# Patient Record
Sex: Male | Born: 1960 | Race: White | Hispanic: No | Smoking: Former smoker
Health system: Southern US, Community
[De-identification: ages and names within clinical notes are randomized; demographics above are authoritative.]

## PROBLEM LIST (undated history)

## (undated) DIAGNOSIS — E785 Hyperlipidemia, unspecified: Secondary | ICD-10-CM

## (undated) DIAGNOSIS — G629 Polyneuropathy, unspecified: Secondary | ICD-10-CM

## (undated) DIAGNOSIS — G4730 Sleep apnea, unspecified: Secondary | ICD-10-CM

## (undated) DIAGNOSIS — I872 Venous insufficiency (chronic) (peripheral): Secondary | ICD-10-CM

## (undated) DIAGNOSIS — K219 Gastro-esophageal reflux disease without esophagitis: Secondary | ICD-10-CM

## (undated) DIAGNOSIS — F419 Anxiety disorder, unspecified: Secondary | ICD-10-CM

## (undated) DIAGNOSIS — F329 Major depressive disorder, single episode, unspecified: Secondary | ICD-10-CM

## (undated) DIAGNOSIS — I10 Essential (primary) hypertension: Secondary | ICD-10-CM

## (undated) DIAGNOSIS — Z87442 Personal history of urinary calculi: Secondary | ICD-10-CM

## (undated) DIAGNOSIS — F32A Depression, unspecified: Secondary | ICD-10-CM

## (undated) DIAGNOSIS — M1990 Unspecified osteoarthritis, unspecified site: Secondary | ICD-10-CM

## (undated) DIAGNOSIS — I2510 Atherosclerotic heart disease of native coronary artery without angina pectoris: Secondary | ICD-10-CM

## (undated) DIAGNOSIS — M109 Gout, unspecified: Secondary | ICD-10-CM

## (undated) DIAGNOSIS — D497 Neoplasm of unspecified behavior of endocrine glands and other parts of nervous system: Secondary | ICD-10-CM

## (undated) HISTORY — DX: Hyperlipidemia, unspecified: E78.5

## (undated) HISTORY — PX: TONSILLECTOMY: SUR1361

## (undated) HISTORY — PX: TONSILLECTOMY AND ADENOIDECTOMY: SHX28

## (undated) HISTORY — DX: Neoplasm of unspecified behavior of endocrine glands and other parts of nervous system: D49.7

## (undated) HISTORY — DX: Atherosclerotic heart disease of native coronary artery without angina pectoris: I25.10

## (undated) HISTORY — DX: Venous insufficiency (chronic) (peripheral): I87.2

## (undated) HISTORY — DX: Sleep apnea, unspecified: G47.30

---
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
## 2013-09-15 HISTORY — PX: ADRENALECTOMY: SHX876

## 2014-03-24 ENCOUNTER — Emergency Department: Payer: Self-pay | Admitting: Emergency Medicine

## 2015-12-15 HISTORY — PX: SALIVARY GLAND SURGERY: SHX768

## 2016-07-17 ENCOUNTER — Other Ambulatory Visit: Payer: Self-pay | Admitting: Podiatry

## 2016-07-18 NOTE — Patient Instructions (Addendum)
Jacob Munoz  07/18/2016     @PREFPERIOPPHARMACY @   Your procedure is scheduled on  07/23/2016   Report to [facility] at  850  A.M.  Call this number if you have problems the morning of surgery:  [phone]   Remember:  Do not eat food or drink liquids after midnight.  Take these medicines the morning of surgery with A SIP OF WATER  Wellbutrin, hydralazine, lisinopril, metoprolol, pamelor, prilosec.   Do not wear jewelry, make-up or nail polish.  Do not wear lotions, powders, or perfumes, or deoderant.  Do not shave 48 hours prior to surgery.  Men may shave face and neck.  Do not bring valuables to the hospital.  [facility] is not responsible for any belongings or valuables.  Contacts, dentures or bridgework may not be worn into surgery.  Leave your suitcase in the car.  After surgery it may be brought to your room.  For patients admitted to the hospital, discharge time will be determined by your treatment team.  Patients discharged the day of surgery will not be allowed to drive home.   Name and phone number of your driver:   family Special instructions:  none  Please read over the following fact sheets that you were given. Anesthesia Post-op Instructions and Care and Recovery After Surgery       Incision Care  An incision (cut) is when a surgeon cuts into your body. After surgery, the cut needs to be well cared for to keep it from getting infected.  HOW TO CARE FOR YOUR CUT  Take medicines only as told by your doctor.  There are many different ways to close and cover a cut, including stitches, skin glue, and adhesive strips. Follow your doctor's instructions on:  Care of the cut.  Bandage (dressing) changes and removal.  Cut closure removal.  Do not take baths, swim, or use a hot tub until your doctor says it is okay. You may shower as told by your doctor.  Return to your normal diet and activities as allowed by your doctor.  Use medicine that  helps lessen itching on your cut as told by your doctor. Do not pick or scratch at your cut.  Drink enough fluids to keep your pee (urine) clear or pale yellow. GET HELP IF:  You have redness, puffiness (swelling), or pain at the site of your cut.  You have fluid, blood, or pus coming from your cut.  Your muscles ache.  You have chills or you feel sick.  You have a bad smell coming from the cut or bandage.  Your cut opens up after stitches, staples, or adhesive strips have been removed.  You keep feeling sick to your stomach (nauseous) or keep throwing up (vomiting).  You have a fever.  You are dizzy. GET HELP RIGHT AWAY IF:  You have a [place].  You pass out (faint).  You have trouble breathing. MAKE SURE YOU:   Understand these instructions.  Will watch your condition.  Will get help right away if you are not doing well or get worse.   This information is not intended to replace advice given to you by your health care provider. Make sure you discuss any questions you have with your health care provider.   Document Released: 11/24/2011 Document Revised: 09/22/2014 Document Reviewed: 10/26/2013 Elsevier Interactive Patient Education 2016 [place] Monitored anesthesia care is an anesthesia service for a medical  procedure. Anesthesia is the loss of the ability to feel pain. It is produced by medicines called anesthetics. It may affect a small area of your body (local anesthesia), a large area of your body (regional anesthesia), or your entire body (general anesthesia). The need for monitored anesthesia care depends your procedure, your condition, and the potential need for regional or general anesthesia. It is often provided during procedures where:   General anesthesia may be needed if there are complications. This is because you need special care when you are under general anesthesia.   You will be under local or regional anesthesia. This is  so that you are able to have higher levels of anesthesia if needed.   You will receive calming medicines (sedatives). This is especially the case if sedatives are given to put you in a semi-conscious state of relaxation (deep sedation). This is because the amount of sedative needed to produce this state can be hard to predict. Too much of a sedative can produce general anesthesia. Monitored anesthesia care is performed by one or more health care providers who have special training in all types of anesthesia. You will need to meet with these health care providers before your procedure. During this meeting, they will ask you about your medical history. They will also give you instructions to follow. (For example, you will need to stop eating and drinking before your procedure. You may also need to stop or change medicines you are taking.) During your procedure, your health care providers will stay with you. They will:   Watch your condition. This includes watching your blood pressure, breathing, and level of pain.   Diagnose and treat problems that occur.   Give medicines if they are needed. These may include calming medicines (sedatives) and anesthetics.   Make sure you are comfortable.  Having monitored anesthesia care does not necessarily mean that you will be under anesthesia. It does mean that your health care providers will be able to manage anesthesia if you need it or if it occurs. It also means that you will be able to have a different type of anesthesia than you are having if you need it. When your procedure is complete, your health care providers will continue to watch your condition. They will make sure any medicines wear off before you are allowed to go home.    This information is not intended to replace advice given to you by your health care provider. Make sure you discuss any questions you have with your health care provider.   Document Released: 05/28/2005 Document Revised:  09/22/2014 Document Reviewed: 10/13/2012 Elsevier Interactive Patient Education 2016 [employer] PATIENT INSTRUCTIONS POST-ANESTHESIA  IMMEDIATELY FOLLOWING SURGERY:  Do not drive or operate machinery for the first twenty four hours after surgery.  Do not make any important decisions for twenty four hours after surgery or while taking narcotic pain medications or sedatives.  If you develop intractable nausea and vomiting or a severe headache please notify your doctor immediately.  FOLLOW-UP:  Please make an appointment with your surgeon as instructed. You do not need to follow up with anesthesia unless specifically instructed to do so.  WOUND CARE INSTRUCTIONS (if applicable):  Keep a dry clean dressing on the anesthesia/puncture wound site if there is drainage.  Once the wound has quit draining you may leave it open to air.  Generally you should leave the bandage intact for twenty four hours unless there is drainage.  If the epidural site drains for more than  36-48 hours please call the anesthesia department.  QUESTIONS?:  Please feel free to call your physician or the hospital operator if you have any questions, and they will be happy to assist you.

## 2016-07-21 ENCOUNTER — Encounter (HOSPITAL_COMMUNITY)
Admission: RE | Admit: 2016-07-21 | Discharge: 2016-07-21 | Disposition: A | Discharge status: Home / Self Care | Payer: BLUE CROSS/BLUE SHIELD | Source: Ambulatory Visit | Attending: Podiatry | Admitting: Podiatry

## 2016-07-21 ENCOUNTER — Encounter (HOSPITAL_COMMUNITY): Payer: Self-pay

## 2016-07-21 ENCOUNTER — Ambulatory Visit (HOSPITAL_COMMUNITY)
Admission: RE | Admit: 2016-07-21 | Discharge: 2016-07-21 | Disposition: A | Discharge status: Home / Self Care | Payer: BLUE CROSS/BLUE SHIELD | Source: Ambulatory Visit | Attending: Podiatry | Admitting: Podiatry

## 2016-07-21 ENCOUNTER — Other Ambulatory Visit: Payer: Self-pay

## 2016-07-21 DIAGNOSIS — Z01818 Encounter for other preprocedural examination: Secondary | ICD-10-CM | POA: Insufficient documentation

## 2016-07-21 DIAGNOSIS — M2030 Hallux varus (acquired), unspecified foot: Secondary | ICD-10-CM

## 2016-07-21 DIAGNOSIS — Z01812 Encounter for preprocedural laboratory examination: Secondary | ICD-10-CM | POA: Insufficient documentation

## 2016-07-21 DIAGNOSIS — M2040 Other hammer toe(s) (acquired), unspecified foot: Secondary | ICD-10-CM | POA: Insufficient documentation

## 2016-07-21 HISTORY — DX: Gastro-esophageal reflux disease without esophagitis: K21.9

## 2016-07-21 HISTORY — DX: Major depressive disorder, single episode, unspecified: F32.9

## 2016-07-21 HISTORY — DX: Depression, unspecified: F32.A

## 2016-07-21 HISTORY — DX: Anxiety disorder, unspecified: F41.9

## 2016-07-21 HISTORY — DX: Essential (primary) hypertension: I10

## 2016-07-21 LAB — CBC WITH DIFFERENTIAL/PLATELET
Basophils Absolute: 0 10*3/uL (ref 0.0–0.1)
Basophils Relative: 0 %
Eosinophils Absolute: 0.3 10*3/uL (ref 0.0–0.7)
Eosinophils Relative: 2 %
HEMATOCRIT: 50.6 % (ref 39.0–52.0)
Hemoglobin: 17.2 g/dL — ABNORMAL HIGH (ref 13.0–17.0)
LYMPHS ABS: 3.2 10*3/uL (ref 0.7–4.0)
LYMPHS PCT: 28 %
MCH: 28.5 pg (ref 26.0–34.0)
MCHC: 34 g/dL (ref 30.0–36.0)
MCV: 83.8 fL (ref 78.0–100.0)
MONO ABS: 1 10*3/uL (ref 0.1–1.0)
MONOS PCT: 9 %
NEUTROS ABS: 6.9 10*3/uL (ref 1.7–7.7)
Neutrophils Relative %: 61 %
Platelets: 211 10*3/uL (ref 150–400)
RBC: 6.04 MIL/uL — ABNORMAL HIGH (ref 4.22–5.81)
RDW: 14.1 % (ref 11.5–15.5)
WBC: 11.3 10*3/uL — ABNORMAL HIGH (ref 4.0–10.5)

## 2016-07-21 LAB — BASIC METABOLIC PANEL
Anion gap: 9 (ref 5–15)
BUN: 20 mg/dL (ref 6–20)
CALCIUM: 9.2 mg/dL (ref 8.9–10.3)
CO2: 25 mmol/L (ref 22–32)
CREATININE: 1.34 mg/dL — AB (ref 0.61–1.24)
Chloride: 101 mmol/L (ref 101–111)
GFR calc Af Amer: >60 mL/min (ref >60)
GFR calc non Af Amer: 59 mL/min — ABNORMAL LOW (ref >60)
GLUCOSE: 179 mg/dL — AB (ref 65–99)
Potassium: 3.2 mmol/L — ABNORMAL LOW (ref 3.5–5.1)
Sodium: 135 mmol/L (ref 135–145)

## 2016-07-21 LAB — SURGICAL PCR SCREEN
MRSA, PCR: NEGATIVE
Staphylococcus aureus: NEGATIVE

## 2016-07-21 MED ORDER — VANCOMYCIN HCL 10 G IV SOLR: 1000.0000 mg | Freq: Once | INTRAVENOUS | Status: DC

## 2016-07-22 NOTE — Pre-Procedure Instructions (Signed)
Dr Patsey Berthold aware of potassium-3.2, glucose-179 and WBC of 11.3. Will repeat Istat on arrival per his order morning of surgery.

## 2016-07-22 NOTE — Pre-Procedure Instructions (Signed)
Abnormal labs called to Dr Serita Grit office. No orders given at this time.

## 2016-07-23 ENCOUNTER — Ambulatory Visit (HOSPITAL_COMMUNITY): Payer: BLUE CROSS/BLUE SHIELD | Admitting: Anesthesiology

## 2016-07-23 ENCOUNTER — Ambulatory Visit (HOSPITAL_COMMUNITY)
Admission: RE | Admit: 2016-07-23 | Discharge: 2016-07-23 | Disposition: A | Discharge status: Home / Self Care | Payer: BLUE CROSS/BLUE SHIELD | Source: Ambulatory Visit | Attending: Podiatry | Admitting: Podiatry

## 2016-07-23 ENCOUNTER — Ambulatory Visit (HOSPITAL_COMMUNITY): Payer: BLUE CROSS/BLUE SHIELD

## 2016-07-23 ENCOUNTER — Encounter (HOSPITAL_COMMUNITY)
Admission: RE | Disposition: A | Discharge status: Home / Self Care | Payer: Self-pay | Source: Ambulatory Visit | Attending: Podiatry

## 2016-07-23 ENCOUNTER — Encounter (HOSPITAL_COMMUNITY): Payer: Self-pay | Admitting: *Deleted

## 2016-07-23 DIAGNOSIS — Z87891 Personal history of nicotine dependence: Secondary | ICD-10-CM | POA: Insufficient documentation

## 2016-07-23 DIAGNOSIS — Z9889 Other specified postprocedural states: Secondary | ICD-10-CM

## 2016-07-23 DIAGNOSIS — M24574 Contracture, right foot: Secondary | ICD-10-CM | POA: Diagnosis not present

## 2016-07-23 DIAGNOSIS — M2041 Other hammer toe(s) (acquired), right foot: Secondary | ICD-10-CM | POA: Insufficient documentation

## 2016-07-23 DIAGNOSIS — F329 Major depressive disorder, single episode, unspecified: Secondary | ICD-10-CM | POA: Diagnosis not present

## 2016-07-23 DIAGNOSIS — I10 Essential (primary) hypertension: Secondary | ICD-10-CM | POA: Diagnosis not present

## 2016-07-23 DIAGNOSIS — L97519 Non-pressure chronic ulcer of other part of right foot with unspecified severity: Secondary | ICD-10-CM | POA: Diagnosis not present

## 2016-07-23 DIAGNOSIS — K219 Gastro-esophageal reflux disease without esophagitis: Secondary | ICD-10-CM | POA: Diagnosis not present

## 2016-07-23 DIAGNOSIS — F419 Anxiety disorder, unspecified: Secondary | ICD-10-CM | POA: Diagnosis not present

## 2016-07-23 HISTORY — PX: DISTAL INTERPHALANGEAL JOINT FUSION: SHX6428

## 2016-07-23 HISTORY — PX: REPAIR EXTENSOR TENDON: SHX5382

## 2016-07-23 LAB — POCT I-STAT 4, (NA,K, GLUC, HGB,HCT)
GLUCOSE: 169 mg/dL — AB (ref 65–99)
HCT: 53 % — ABNORMAL HIGH (ref 39.0–52.0)
HEMOGLOBIN: 18 g/dL — AB (ref 13.0–17.0)
POTASSIUM: 4.1 mmol/L (ref 3.5–5.1)
SODIUM: 139 mmol/L (ref 135–145)

## 2016-07-23 LAB — GLUCOSE, CAPILLARY: Glucose-Capillary: 156 mg/dL — ABNORMAL HIGH (ref 65–99)

## 2016-07-23 SURGERY — DISTAL INTERPHALANGEAL JOINT FUSION
Anesthesia: Monitor Anesthesia Care | Site: Toe | Laterality: Right

## 2016-07-23 MED ORDER — 0.9 % SODIUM CHLORIDE (POUR BTL) OPTIME
TOPICAL | Status: DC | PRN
  Administered 2016-07-23: 1000 mL

## 2016-07-23 MED ORDER — FENTANYL CITRATE (PF) 100 MCG/2ML IJ SOLN: 25.0000 ug | INTRAMUSCULAR | Status: DC | PRN

## 2016-07-23 MED ORDER — DEXTROSE 5 % IV SOLN: INTRAVENOUS | Status: DC | PRN

## 2016-07-23 MED ORDER — CHLORHEXIDINE GLUCONATE CLOTH 2 % EX PADS: 6.0000 | MEDICATED_PAD | Freq: Once | CUTANEOUS | Status: DC

## 2016-07-23 MED ORDER — BUPIVACAINE HCL (PF) 0.5 % IJ SOLN
INTRAMUSCULAR | Status: AC
  Filled 2016-07-23: qty 30

## 2016-07-23 MED ORDER — LACTATED RINGERS IV SOLN
INTRAVENOUS | Status: DC
  Administered 2016-07-23: 1000 mL via INTRAVENOUS

## 2016-07-23 MED ORDER — MIDAZOLAM HCL 5 MG/5ML IJ SOLN
INTRAMUSCULAR | Status: DC | PRN
  Administered 2016-07-23: 2 mg via INTRAVENOUS

## 2016-07-23 MED ORDER — PROPOFOL 500 MG/50ML IV EMUL
INTRAVENOUS | Status: DC | PRN
  Administered 2016-07-23: 50 ug/kg/min via INTRAVENOUS
  Administered 2016-07-23: 12:00:00 via INTRAVENOUS

## 2016-07-23 MED ORDER — FENTANYL CITRATE (PF) 100 MCG/2ML IJ SOLN
INTRAMUSCULAR | Status: AC
  Filled 2016-07-23: qty 2

## 2016-07-23 MED ORDER — LIDOCAINE HCL (PF) 1 % IJ SOLN
INTRAMUSCULAR | Status: AC
  Filled 2016-07-23: qty 30

## 2016-07-23 MED ORDER — FENTANYL CITRATE (PF) 100 MCG/2ML IJ SOLN
25.0000 ug | INTRAMUSCULAR | Status: AC | PRN
  Administered 2016-07-23 (×2): 25 ug via INTRAVENOUS
  Filled 2016-07-23: qty 2

## 2016-07-23 MED ORDER — MIDAZOLAM HCL 2 MG/2ML IJ SOLN
1.0000 mg | INTRAMUSCULAR | Status: DC | PRN
  Administered 2016-07-23 (×2): 2 mg via INTRAVENOUS
  Filled 2016-07-23 (×2): qty 2

## 2016-07-23 MED ORDER — VANCOMYCIN HCL IN DEXTROSE 1-5 GM/200ML-% IV SOLN
1000.0000 mg | Freq: Once | INTRAVENOUS | Status: AC
Start: 2016-07-23 — End: 2016-07-23
  Administered 2016-07-23: 1000 mg via INTRAVENOUS
  Filled 2016-07-23: qty 200

## 2016-07-23 MED ORDER — BUPIVACAINE HCL 0.5 % IJ SOLN
INTRAMUSCULAR | Status: DC | PRN
  Administered 2016-07-23: 10 mL via INTRAMUSCULAR

## 2016-07-23 MED ORDER — FENTANYL CITRATE (PF) 100 MCG/2ML IJ SOLN
INTRAMUSCULAR | Status: DC | PRN
  Administered 2016-07-23 (×2): 50 ug via INTRAVENOUS

## 2016-07-23 MED ORDER — BUPIVACAINE HCL (PF) 0.5 % IJ SOLN
INTRAMUSCULAR | Status: DC | PRN
  Administered 2016-07-23: 10 mL

## 2016-07-23 SURGICAL SUPPLY — 57 items
BAG HAMPER (MISCELLANEOUS) ×3 IMPLANT
BANDAGE ESMARK 4X12 BL STRL LF (DISPOSABLE) ×1 IMPLANT
BENZOIN TINCTURE PRP APPL 2/3 (GAUZE/BANDAGES/DRESSINGS) ×3 IMPLANT
BIT DRILL 2.0X128 (BIT) ×2 IMPLANT
BIT DRILL 2.0X128MM (BIT) ×1
BLADE 15 SAFETY STRL DISP (BLADE) ×6 IMPLANT
BLADE AVERAGE 25MMX9MM (BLADE) ×1
BLADE AVERAGE 25X9 (BLADE) ×2 IMPLANT
BNDG COHESIVE 4X5 TAN STRL (GAUZE/BANDAGES/DRESSINGS) ×3 IMPLANT
BNDG CONFORM 2 STRL LF (GAUZE/BANDAGES/DRESSINGS) ×3 IMPLANT
BNDG ESMARK 4X12 BLUE STRL LF (DISPOSABLE) ×3
BNDG GAUZE ELAST 4 BULKY (GAUZE/BANDAGES/DRESSINGS) ×3 IMPLANT
BOOT STEPPER DURA LG (SOFTGOODS) IMPLANT
BOOT STEPPER DURA MED (SOFTGOODS) IMPLANT
BOOT STEPPER DURA SM (SOFTGOODS) IMPLANT
BOOT STEPPER DURA XLG (SOFTGOODS) IMPLANT
CHLORAPREP W/TINT 26ML (MISCELLANEOUS) ×3 IMPLANT
CLIP EASY STAPLE NITINOL (Clip) ×3 IMPLANT
CLIP EZ FIXATION 10X10X10 (Staple) ×3 IMPLANT
CLOSURE WOUND 1/2 X4 (GAUZE/BANDAGES/DRESSINGS) ×1
CLOTH BEACON ORANGE TIMEOUT ST (SAFETY) ×3 IMPLANT
COVER LIGHT HANDLE STERIS (MISCELLANEOUS) ×6 IMPLANT
CUFF TOURNIQUET SINGLE 18IN (TOURNIQUET CUFF) ×3 IMPLANT
CUFF TOURNIQUET SINGLE 24IN (TOURNIQUET CUFF) ×3 IMPLANT
DECANTER SPIKE VIAL GLASS SM (MISCELLANEOUS) ×6 IMPLANT
DRAPE OEC MINIVIEW 54X84 (DRAPES) ×3 IMPLANT
DRSG ADAPTIC 3X8 NADH LF (GAUZE/BANDAGES/DRESSINGS) ×3 IMPLANT
DRSG TELFA 3X8 NADH (GAUZE/BANDAGES/DRESSINGS) ×3 IMPLANT
ELECT REM PT RETURN 9FT ADLT (ELECTROSURGICAL) ×3
ELECTRODE REM PT RTRN 9FT ADLT (ELECTROSURGICAL) ×1 IMPLANT
GAUZE SPONGE 4X4 12PLY STRL (GAUZE/BANDAGES/DRESSINGS) ×3 IMPLANT
GLOVE BIO SURGEON STRL SZ7.5 (GLOVE) ×3 IMPLANT
GLOVE BIOGEL PI IND STRL 6.5 (GLOVE) ×1 IMPLANT
GLOVE BIOGEL PI IND STRL 7.0 (GLOVE) ×2 IMPLANT
GLOVE BIOGEL PI INDICATOR 6.5 (GLOVE) ×2
GLOVE BIOGEL PI INDICATOR 7.0 (GLOVE) ×4
GLOVE ECLIPSE 7.0 STRL STRAW (GLOVE) ×3 IMPLANT
GOWN STRL REUS W/ TWL LRG LVL3 (GOWN DISPOSABLE) ×2 IMPLANT
GOWN STRL REUS W/TWL LRG LVL3 (GOWN DISPOSABLE) ×7 IMPLANT
K-WIRE 6 (WIRE) ×3
KIT ROOM TURNOVER AP CYSTO (KITS) ×3 IMPLANT
KWIRE 6 (WIRE) ×1 IMPLANT
MANIFOLD NEPTUNE II (INSTRUMENTS) ×3 IMPLANT
NEEDLE HYPO 27GX1-1/4 (NEEDLE) ×6 IMPLANT
NS IRRIG 1000ML POUR BTL (IV SOLUTION) ×3 IMPLANT
PACK BASIC LIMB (CUSTOM PROCEDURE TRAY) ×3 IMPLANT
PAD ARMBOARD 7.5X6 YLW CONV (MISCELLANEOUS) ×3 IMPLANT
RASP SM TEAR CROSS CUT (RASP) ×3 IMPLANT
SET BASIN LINEN APH (SET/KITS/TRAYS/PACK) ×3 IMPLANT
SPONGE LAP 18X18 X RAY DECT (DISPOSABLE) ×3 IMPLANT
STRIP CLOSURE SKIN 1/2X4 (GAUZE/BANDAGES/DRESSINGS) ×2 IMPLANT
SUT MON AB 5-0 PS2 18 (SUTURE) ×3 IMPLANT
SUT VIC AB 3-0 SH 27 (SUTURE) ×2
SUT VIC AB 3-0 SH 27X BRD (SUTURE) ×1 IMPLANT
SUT VIC AB 4-0 PS2 27 (SUTURE) ×3 IMPLANT
SYR CONTROL 10ML LL (SYRINGE) ×6 IMPLANT
WATER STERILE IRR 1000ML POUR (IV SOLUTION) ×3 IMPLANT

## 2016-07-23 NOTE — Transfer of Care (Signed)
Immediate Anesthesia Transfer of Care Note  Patient: Jacob Munoz  Procedure(s) Performed: Procedure(s): RIGHT 1ST INTERPHALANGEAL JOINT FUSION (Right) EXTENSOR TENDON LENGTHENING (Right)  Patient Location: PACU  Anesthesia Type:MAC  Level of Consciousness: awake, alert  and oriented  Airway & Oxygen Therapy: Patient Spontanous Breathing and Patient connected to nasal cannula oxygen  Post-op Assessment: Report given to RN  Post vital signs: Reviewed and stable  Last Vitals:  Vitals:   07/23/16 1045 07/23/16 1050  BP: (!) 109/58 119/75  Pulse:    Resp: 12 (!) 22  Temp:      Last Pain: There were no vitals filed for this visit.       Complications: No apparent anesthesia complications

## 2016-07-23 NOTE — H&P (Signed)
HISTORY AND PHYSICAL INTERVAL NOTE:  07/23/2016  10:23 AM  Jacob Munoz  has presented today for surgery, with the diagnosis of acquired hallux malleus.  The various methods of treatment have been discussed with the patient.  No guarantees were given.  After consideration of risks, benefits and other options for treatment, the patient has consented to surgery.  I have reviewed the patients' chart and labs.    Patient Vitals for the past 24 hrs:  BP Temp Pulse Resp SpO2  07/23/16 0950 - - - 13 92 %  07/23/16 0945 124/73 - - 16 93 %  07/23/16 0940 130/88 - - 14 93 %  07/23/16 0935 132/87 - - 17 94 %  07/23/16 0930 132/87 97.8 F (36.6 C) 77 - 93 %    A history and physical examination was performed in my office.  The patient was reexamined.  There have been no changes to this history and physical examination.  Tyson Babinski, DPM

## 2016-07-23 NOTE — Discharge Instructions (Signed)

## 2016-07-23 NOTE — Anesthesia Postprocedure Evaluation (Signed)
Anesthesia Post Note  Patient: Jacob Munoz  Procedure(s) Performed: Procedure(s) (LRB): RIGHT 1ST INTERPHALANGEAL JOINT FUSION (Right) EXTENSOR TENDON LENGTHENING (Right)  Patient location during evaluation: PACU Anesthesia Type: MAC Level of consciousness: awake and alert and oriented Pain management: pain level controlled Vital Signs Assessment: post-procedure vital signs reviewed and stable Respiratory status: spontaneous breathing Cardiovascular status: blood pressure returned to baseline Postop Assessment: no signs of nausea or vomiting and adequate PO intake Anesthetic complications: no    Last Vitals:  Vitals:   07/23/16 1045 07/23/16 1050  BP: (!) 109/58 119/75  Pulse:    Resp: 12 (!) 22  Temp:      Last Pain: There were no vitals filed for this visit.               Niva Murren

## 2016-07-23 NOTE — Anesthesia Procedure Notes (Signed)
Procedure Name: MAC Date/Time: 07/23/2016 10:56 AM Performed by: Andree Elk, AMY A Pre-anesthesia Checklist: Patient identified, Timeout performed, Emergency Drugs available, Suction available and Patient being monitored Oxygen Delivery Method: Simple face mask

## 2016-07-23 NOTE — Anesthesia Preprocedure Evaluation (Signed)
Anesthesia Evaluation  Patient identified by MRN, date of birth, ID band Patient awake    Reviewed: Allergy & Precautions, NPO status , Patient's Chart, lab work & pertinent test results  Airway Mallampati: II  TM Distance: >3 FB     Dental  (+) Teeth Intact   Pulmonary former smoker,    breath sounds clear to auscultation       Cardiovascular hypertension, Pt. on home beta blockers and Pt. on medications  Rhythm:Regular Rate:Normal     Neuro/Psych PSYCHIATRIC DISORDERS Anxiety Depression    GI/Hepatic GERD  ,  Endo/Other    Renal/GU      Musculoskeletal   Abdominal   Peds  Hematology   Anesthesia Other Findings   Reproductive/Obstetrics                             Anesthesia Physical Anesthesia Plan  ASA: II  Anesthesia Plan: MAC   Post-op Pain Management:    Induction: Intravenous  Airway Management Planned: Simple Face Mask  Additional Equipment:   Intra-op Plan:   Post-operative Plan:   Informed Consent: I have reviewed the patients History and Physical, chart, labs and discussed the procedure including the risks, benefits and alternatives for the proposed anesthesia with the patient or authorized representative who has indicated his/her understanding and acceptance.     Plan Discussed with:   Anesthesia Plan Comments:         Anesthesia Quick Evaluation

## 2016-07-23 NOTE — Op Note (Signed)
OPERATIVE NOTE  DATE OF PROCEDURE 07/23/2016  Surgeon Tyson Babinski, DPM  Assistant Marcheta Grammes, DPM  OR STAFF Circulator: Towanda Malkin, RN Scrub Person: Royanne Foots Circulator Assistant: Venda Rodes, RN; Donell Beers, RN Float Surgical Tech: Lucie Leather, CST   PREOPERATIVE DIAGNOSIS right acquired hallux malleus  POSTOPERATIVE DIAGNOSIS Same  PROCEDURE Right IPJ fusion with extensor hallucis longus tendon lengthening   ANESTHESIA Monitor Anesthesia Care   HEMOSTASIS Pneumatic ankle tourniquet set at 250 mmHg  ESTIMATED BLOOD LOSS Minimal (<5 cc)  MATERIALS USED Stryker Easy clip x 2  INJECTABLES 0.5% Marcaine plain cc  PATHOLOGY Bone from the head of proximal phalanx and distal phalanx base.   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   DESCRIPTION OF THE PROCEDURE:  The patient was brought to the operating room and placed on the operative table in the supine position.  A pneumatic ankle tourniquet was applied to the operative extremity.  Following sedation, the surgical site was anesthetized with mixutre of 1%lidocaine with 0.5% Marcaine plain.  The foot was then prepped, scrubbed, and draped in the usual sterile technique.  The foot was elevated, exsanguinated and the pneumatic ankle tourniquet inflated to 250 mmHg.  Attention was directed to the dorsal first interphalangeal joint of the right hallux.  The skin incision was marked with marking pen. Skin incision was made usin skin blade. Using new blade and Sharp and blunt dissection was carried down to subcuatneous tissue. Bleeders were ligated using bovi. At this point extensor hallucis longus tendon was visuliazed and was taught.  Tendon was splint in longtidunal z split fashion. At this time attention was directed at the distal interphalangeal joint, The capsule and soft tissue were removed from the head of proximal  phalanx and base of the distal phalanx. Arthrtic changes noted to the interphalnalgeal joint. Using saggital saw the head of the proximal phalanx and base of distal phalanx was removed. Subchondral drilling was perfomed at the base of the distal phalanx and head of proximal phalanx using k wire. At this osteotomy site was flushed with normal saline. At this time one eazy clip from styker size 10 was placed on dorsal lateral aspet of the joint. Fluroscopy was used to confirmed the position of the staple and alignment of joint. At this time second staple size 12 was placed on the dorsal medial aspect of the joint. Once again fluroscopy was used to visualize the postion of staple and alignment of the joint. Compression of the joint was visualized. There was no gapping noted. At this time sugical site was flushed with normal saline. THe extensor hallucis longus tendon was sutured with 3-0 vicryl. The sub cutaneous tissue closed using 4-0 vicryl and skin was closed using 3-0 prolene. steristrips and dry sterile dressing was applied. Tourniquet was deflated. Immediate capillary refill noted to all lesser toes.    The patient tolerated the procedure well.  The patient was then transferred to PACU with vital signs stable and vascular status intact to all toes of the operative foot.

## 2016-07-25 ENCOUNTER — Encounter (HOSPITAL_COMMUNITY): Payer: Self-pay | Admitting: Podiatry

## 2018-06-14 ENCOUNTER — Other Ambulatory Visit: Payer: Self-pay | Admitting: Podiatry

## 2018-06-14 NOTE — Patient Instructions (Signed)
Jacob Munoz  06/14/2018     @PREFPERIOPPHARMACY @   Your procedure is scheduled on  06/16/2018 .  Report to [name] at   1130  A.M.  Call this number if you have problems the morning of surgery:  [phone]   Remember:  Do not eat or drink after midnight.  You may drink clear liquids until  12 midnight 06/15/2018 .  Clear liquids allowed are:                    Water, Juice (non-citric and without pulp), Carbonated beverages, Clear Tea, Black Coffee only, Plain Jell-O only, Gatorade and Plain Popsicles only    Take these medicines the morning of surgery with A SIP OF WATER  Wellbutrin, apresoline, lisinopril, metoprolol, nortriptyline, prilosec.    Do not wear jewelry, make-up or nail polish.  Do not wear lotions, powders, or perfumes, or deodorant.  Do not shave 48 hours prior to surgery.  Men may shave face and neck.  Do not bring valuables to the hospital.  [facility] is not responsible for any belongings or valuables.  Contacts, dentures or bridgework may not be worn into surgery.  Leave your suitcase in the car.  After surgery it may be brought to your room.  For patients admitted to the hospital, discharge time will be determined by your treatment team.  Patients discharged the day of surgery will not be allowed to drive home.   Name and phone number of your driver:   family Special instructions:  None  Please read over the following fact sheets that you were given. Anesthesia Post-op Instructions and Care and Recovery After Surgery       Metatarsal Osteotomy Metatarsal osteotomy is a surgical procedure to correct a toe dislocation or deformity. The surgery may also help to relieve foot pain. Your metatarsals are the five long bones that connect your toes to the rest of your foot. Osteotomy is a surgical cut into a bone to reshape and reposition the bone or joint. Tell a health care provider about:  Any allergies you have.  All medicines  you are taking, including vitamins, herbs, eye drops, creams, and over-the-counter medicines.  Any problems you or family members have had with anesthetic medicines.  Any blood disorders you have.  Any surgeries you have had.  Any medical conditions you have. What are the risks? Generally, this is a safe procedure. However, problems may occur, including:  Stiffness.  Pain.  Infection.  Bleeding.  Allergic reactions to medicines.  Nerve damage that causes numbness.  Failure of the osteotomy to heal.  A blood clot that forms in your leg and travels to your lungs (pulmonary embolism).  What happens before the procedure?  Your health care provider may order imaging tests of your foot.  Follow instructions from your health care provider about eating or drinking restrictions.  Ask your health care provider about: ? Changing or stopping your regular medicines. This is especially important if you are taking diabetes medicines or blood thinners. ? Taking medicines such as aspirin and ibuprofen. These medicines can thin your blood. Do not take these medicines before your procedure if your health care provider instructs you not to.  Plan to have someone take you home after the procedure.  Ask your health care provider how your surgical site will be marked or identified.  You may be given antibiotic medicine to help prevent infection.  What happens during the procedure?  To reduce your risk of infection: ? Your health care team will wash or sanitize their hands. ? Your skin will be washed with soap. ? Hair may be removed from the surgical area.  An IV tube will be started in one of your veins.  You will be given one or more of the following: ? A medicine to help you relax (sedative). ? A medicine to numb the area (local anesthetic). ? A medicine to make you fall asleep (general anesthetic). ? A medicine that is injected into your spine to numb the area below and slightly  above the injection site (spinal anesthetic). ? A medicine that is injected into an area of your body to numb everything below the injection site (regional anesthetic).  A surgical cut (incision) will be made on your foot over the metatarsal bone that will be treated.  A cut will be made in the bone to shorten or straighten the bone.  Metal pins, plates, or screws may be used to hold the bone in the right position.  The incision will be closed with stitches (sutures) or staples.  A bandage (dressing) will be placed around the front and bottom of your foot. The procedure may vary among health care providers and hospitals. What happens after the procedure?  Your blood pressure, heart rate, breathing rate, and blood oxygen level will be monitored often until the medicines you were given have worn off.  You may be given walking aids, such as: ? A custom-fitted hard-soled shoe that keeps weight on your heel. ? A walking boot. ? A splint. ? Crutches or a walker to help you walk without putting weight on your foot.  Do not drive for 24 hours if you received a sedative. This information is not intended to replace advice given to you by your health care provider. Make sure you discuss any questions you have with your health care provider. Document Released: 08/13/2015 Document Revised: 02/07/2016 Document Reviewed: 04/26/2015 Elsevier Interactive Patient Education  2018 [place].  Metatarsal Osteotomy, Care After Refer to this sheet in the next few weeks. These instructions provide you with information about caring for yourself after your procedure. Your health care provider may also give you more specific instructions. Your treatment has been planned according to current medical practices, but problems sometimes occur. Call your health care provider if you have any problems or questions after your procedure. What can I expect after the procedure? After the procedure, it is common to  have:  Soreness.  Pain.  Stiffness.  Swelling.  Follow these instructions at home: If you have a splint:  Wear the splint as told by your health care provider. Remove it only as told by your health care provider.  Loosen the splint if your toes tingle, become numb, or turn cold and blue.  Do not let your splint get wet if it is not waterproof.  Keep the splint clean. Bathing  Do not take baths, swim, or use a hot tub until your health care provider approves. Ask your health care provider if you can take showers. You may only be allowed to take sponge baths for bathing.  If your splint is not waterproof, cover it with a watertight plastic bag when you take a bath or a shower.  Keep the bandage (dressing) dry until your health care provider says it can be removed. Incision care  Follow instructions from your health care provider about how to take care of  your cut from surgery (incision). Make sure you: ? Wash your hands with soap and water before you change your bandage (dressing). If soap and water are not available, use hand sanitizer. ? Change your dressing as told by your health care provider. ? Leave stitches (sutures), skin glue, or adhesive strips in place. These skin closures may need to stay in place for 2 weeks or longer. If adhesive strip edges start to loosen and curl up, you may trim the loose edges. Do not remove adhesive strips completely unless your health care provider tells you to do that.  Check your incision area every day for signs of infection. Check for: ? More redness, swelling, or pain. ? More fluid or blood. ? Warmth. ? Pus or a bad smell. Managing pain, stiffness, and swelling   If directed, apply ice to the injured area. ? Put ice in a plastic bag. ? Place a towel between your skin and the bag. ? Leave the ice on for 20 minutes, 2-3 times a day.  Move your toes often to avoid stiffness and to lessen swelling.  Raise (elevate) the injured area  above the level of your heart while you are sitting or lying down. Driving  Do not drive or operate heavy machinery while taking prescription pain medicine.  Do not drive for 24 hours if you received a sedative.  Ask your health care provider when it is safe to drive if you have a dressing, splint, special shoe, or walking boot on your foot. General instructions  If you were given a splint, special shoe, or walking boot, wear it as told by your health care provider.  Return to your normal activities as told by your health care provider. Ask your health care provider what activities are safe for you.  Do not use the injured limb to support your body weight until your health care provider says that you can. Use crutches or a walker as told by your health care provider.  Do not use any tobacco products, such as cigarettes, chewing tobacco, and e-cigarettes. Tobacco can delay bone healing. If you need help quitting, ask your health care provider.  Take over-the-counter and prescription medicines only as told by your health care provider.  Keep all follow-up visits as told by your health care provider. This is important. Contact a health care provider if:  You have a fever.  Your dressing becomes wet, loose, or stained with blood or discharge.  You have pus or a bad smell coming from your incision or bandage.  Your foot becomes red, swollen, or tender.  You have pain or stiffness that does not get better or gets worse.  You have tingling or numbness in your foot that does not get better or gets worse. Get help right away if:  You develop a warm and tender swelling in your leg.  You have chest pain.  You have trouble breathing. This information is not intended to replace advice given to you by your health care provider. Make sure you discuss any questions you have with your health care provider. Document Released: 08/13/2015 Document Revised: 02/07/2016 Document Reviewed:  04/26/2015 Elsevier Interactive Patient Education  2018 [place]. Phantom Limb Pain Phantom limb pain occurs in an arm or leg following an amputation. It is pain in an extremity that no longer exists. This pain varies with different patients. Different activities may cause the pain. Some people with an amputated limb experience the opposite of phantom pain, which is phantom pleasure.  The trouble may start in a part of the brain known as the sensory cortex. The sensory cortex is the portion of your brain that processes sensations from the rest of your body. It is hypothesized that when a body part is lost, the corresponding part of the brain is not able to handle the loss and rewires its circuitry to make up for the signals it no longer receives from the missing extremity. The exact mechanism of how phantom limb pain occurs is not known. The severity of pain seems to be correlated with personal problems such as stress and attitude. It also seems to correlate with the amount of pain a person had before the operation. What are the causes?  Damaged nerve endings.  Scar tissue at the amputation site. How is this treated? Phantom limb pain can be severe and debilitating. Most cases of phantom limb pain only last briefly. There are a number of different therapies and medications that may give relief. Keep working with your health care provider until relief is obtained. Some treatments that may be helpful include:  Hypnosis and mental imagery. Their techniques can give patients the needed impetus to recognize their ability to regain control.  Biofeedback.  Relaxation techniques. They are related to hypnosis techniques and use the mind and body to control pain.  Acupuncture.  Massage.  Exercise.  Antidepressant medicine.  Anticonvulsant medicine.  Narcotics or pain medicines.  Contact a health care provider if: Pain is not relieved or increases. This information is not intended to  replace advice given to you by your health care provider. Make sure you discuss any questions you have with your health care provider. Document Released: 11/22/2002 Document Revised: 05/13/2016 Document Reviewed: 02/01/2013 Elsevier Interactive Patient Education  2017 [place] Anesthesia, Adult General anesthesia is the use of medicines to make a person "go to sleep" (be unconscious) for a medical procedure. General anesthesia is often recommended when a procedure:  Is long.  Requires you to be still or in an unusual position.  Is major and can cause you to lose blood.  Is impossible to do without general anesthesia.  The medicines used for general anesthesia are called general anesthetics. In addition to making you sleep, the medicines:  Prevent pain.  Control your blood pressure.  Relax your muscles.  Tell a health care provider about:  Any allergies you have.  All medicines you are taking, including vitamins, herbs, eye drops, creams, and over-the-counter medicines.  Any problems you or family members have had with anesthetic medicines.  Types of anesthetics you have had in the past.  Any bleeding disorders you have.  Any surgeries you have had.  Any medical conditions you have.  Any history of heart or lung conditions, such as heart failure, sleep apnea, or chronic obstructive pulmonary disease (COPD).  Whether you are pregnant or may be pregnant.  Whether you use tobacco, alcohol, marijuana, or street drugs.  Any history of Armed forces logistics/support/administrative officer.  Any history of depression or anxiety. What are the risks? Generally, this is a safe procedure. However, problems may occur, including:  Allergic reaction to anesthetics.  Lung and heart problems.  Inhaling food or liquids from your stomach into your lungs (aspiration).  Injury to nerves.  Waking up during your procedure and being unable to move (rare).  Extreme agitation or a state of mental  confusion (delirium) when you wake up from the anesthetic.  Air in the bloodstream, which can lead to stroke.  These problems  are more likely to develop if you are having a major surgery or if you have an advanced medical condition. You can prevent some of these complications by answering all of your health care provider's questions thoroughly and by following all pre-procedure instructions. General anesthesia can cause side effects, including:  Nausea or vomiting  A sore throat from the breathing tube.  Feeling cold or shivery.  Feeling tired, washed out, or achy.  Sleepiness or drowsiness.  Confusion or agitation.  What happens before the procedure? Staying hydrated Follow instructions from your health care provider about hydration, which may include:  Up to 2 hours before the procedure - you may continue to drink clear liquids, such as water, clear fruit juice, black coffee, and plain tea.  Eating and drinking restrictions Follow instructions from your health care provider about eating and drinking, which may include:  8 hours before the procedure - stop eating heavy meals or foods such as meat, fried foods, or fatty foods.  6 hours before the procedure - stop eating light meals or foods, such as toast or cereal.  6 hours before the procedure - stop drinking milk or drinks that contain milk.  2 hours before the procedure - stop drinking clear liquids.  Medicines  Ask your health care provider about: ? Changing or stopping your regular medicines. This is especially important if you are taking diabetes medicines or blood thinners. ? Taking medicines such as aspirin and ibuprofen. These medicines can thin your blood. Do not take these medicines before your procedure if your health care provider instructs you not to. ? Taking new dietary supplements or medicines. Do not take these during the week before your procedure unless your health care provider approves them.  If you  are told to take a medicine or to continue taking a medicine on the day of the procedure, take the medicine with sips of water. General instructions   Ask if you will be going home the same day, the following day, or after a longer hospital stay. ? Plan to have someone take you home. ? Plan to have someone stay with you for the first 24 hours after you leave the hospital or clinic.  For 3-6 weeks before the procedure, try not to use any tobacco products, such as cigarettes, chewing tobacco, and e-cigarettes.  You may brush your teeth on the morning of the procedure, but make sure to spit out the toothpaste. What happens during the procedure?  You will be given anesthetics through a mask and through an IV tube in one of your veins.  You may receive medicine to help you relax (sedative).  As soon as you are asleep, a breathing tube may be used to help you breathe.  An anesthesia specialist will stay with you throughout the procedure. He or she will help keep you comfortable and safe by continuing to give you medicines and adjusting the amount of medicine that you get. He or she will also watch your blood pressure, pulse, and oxygen levels to make sure that the anesthetics do not cause any problems.  If a breathing tube was used to help you breathe, it will be removed before you wake up. The procedure may vary among health care providers and hospitals. What happens after the procedure?  You will wake up, often slowly, after the procedure is complete, usually in a recovery area.  Your blood pressure, heart rate, breathing rate, and blood oxygen level will be monitored until the medicines you were  given have worn off.  You may be given medicine to help you calm down if you feel anxious or agitated.  If you will be going home the same day, your health care provider may check to make sure you can stand, drink, and urinate.  Your health care providers will treat your pain and side effects  before you go home.  Do not drive for 24 hours if you received a sedative.  You may: ? Feel nauseous and vomit. ? Have a sore throat. ? Have mental slowness. ? Feel cold or shivery. ? Feel sleepy. ? Feel tired. ? Feel sore or achy, even in parts of your body where you did not have surgery. This information is not intended to replace advice given to you by your health care provider. Make sure you discuss any questions you have with your health care provider. Document Released: 12/09/2007 Document Revised: 02/12/2016 Document Reviewed: 08/16/2015 Elsevier Interactive Patient Education  2018 [place] Anesthesia, Adult, Care After These instructions provide you with information about caring for yourself after your procedure. Your health care provider may also give you more specific instructions. Your treatment has been planned according to current medical practices, but problems sometimes occur. Call your health care provider if you have any problems or questions after your procedure. What can I expect after the procedure? After the procedure, it is common to have:  Vomiting.  A sore throat.  Mental slowness.  It is common to feel:  Nauseous.  Cold or shivery.  Sleepy.  Tired.  Sore or achy, even in parts of your body where you did not have surgery.  Follow these instructions at home: For at least 24 hours after the procedure:  Do not: ? Participate in activities where you could fall or become injured. ? Drive. ? Use heavy machinery. ? Drink alcohol. ? Take sleeping pills or medicines that cause drowsiness. ? Make important decisions or sign legal documents. ? Take care of children on your own.  Rest. Eating and drinking  If you vomit, drink water, juice, or soup when you can drink without vomiting.  Drink enough fluid to keep your urine clear or pale yellow.  Make sure you have little or no nausea before eating solid foods.  Follow the diet  recommended by your health care provider. General instructions  Have a responsible adult stay with you until you are awake and alert.  Return to your normal activities as told by your health care provider. Ask your health care provider what activities are safe for you.  Take over-the-counter and prescription medicines only as told by your health care provider.  If you smoke, do not smoke without supervision.  Keep all follow-up visits as told by your health care provider. This is important. Contact a health care provider if:  You continue to have nausea or vomiting at home, and medicines are not helpful.  You cannot drink fluids or start eating again.  You cannot urinate after 8-12 hours.  You develop a skin [place].  You have fever.  You have increasing redness at the site of your procedure. Get help right away if:  You have difficulty breathing.  You have chest pain.  You have unexpected bleeding.  You feel that you are having a life-threatening or urgent problem. This information is not intended to replace advice given to you by your health care provider. Make sure you discuss any questions you have with your health care provider. Document Released: 12/08/2000 Document Revised:  02/04/2016 Document Reviewed: 08/16/2015 Elsevier Interactive Patient Education  [employer].

## 2018-06-15 ENCOUNTER — Other Ambulatory Visit: Payer: Self-pay

## 2018-06-15 ENCOUNTER — Ambulatory Visit (HOSPITAL_COMMUNITY)
Admission: RE | Admit: 2018-06-15 | Discharge: 2018-06-15 | Disposition: A | Discharge status: Home / Self Care | Payer: BLUE CROSS/BLUE SHIELD | Source: Ambulatory Visit | Attending: Podiatry | Admitting: Podiatry

## 2018-06-15 ENCOUNTER — Encounter (HOSPITAL_COMMUNITY)
Admission: RE | Admit: 2018-06-15 | Discharge: 2018-06-15 | Disposition: A | Discharge status: Home / Self Care | Payer: BLUE CROSS/BLUE SHIELD | Source: Ambulatory Visit | Attending: Podiatry | Admitting: Podiatry

## 2018-06-15 ENCOUNTER — Encounter (HOSPITAL_COMMUNITY): Payer: Self-pay

## 2018-06-15 ENCOUNTER — Other Ambulatory Visit (HOSPITAL_COMMUNITY): Payer: Self-pay | Admitting: Podiatry

## 2018-06-15 DIAGNOSIS — I444 Left anterior fascicular block: Secondary | ICD-10-CM | POA: Diagnosis not present

## 2018-06-15 DIAGNOSIS — L97529 Non-pressure chronic ulcer of other part of left foot with unspecified severity: Secondary | ICD-10-CM | POA: Insufficient documentation

## 2018-06-15 DIAGNOSIS — R9431 Abnormal electrocardiogram [ECG] [EKG]: Secondary | ICD-10-CM | POA: Insufficient documentation

## 2018-06-15 DIAGNOSIS — L03032 Cellulitis of left toe: Secondary | ICD-10-CM | POA: Diagnosis not present

## 2018-06-15 DIAGNOSIS — M85872 Other specified disorders of bone density and structure, left ankle and foot: Secondary | ICD-10-CM | POA: Insufficient documentation

## 2018-06-15 DIAGNOSIS — Z01818 Encounter for other preprocedural examination: Secondary | ICD-10-CM | POA: Insufficient documentation

## 2018-06-15 DIAGNOSIS — M869 Osteomyelitis, unspecified: Secondary | ICD-10-CM | POA: Insufficient documentation

## 2018-06-15 DIAGNOSIS — M86172 Other acute osteomyelitis, left ankle and foot: Secondary | ICD-10-CM

## 2018-06-15 HISTORY — DX: Polyneuropathy, unspecified: G62.9

## 2018-06-15 HISTORY — DX: Personal history of urinary calculi: Z87.442

## 2018-06-15 HISTORY — DX: Gout, unspecified: M10.9

## 2018-06-15 LAB — BASIC METABOLIC PANEL
ANION GAP: 9 (ref 5–15)
BUN: 21 mg/dL — AB (ref 6–20)
CHLORIDE: 109 mmol/L (ref 98–111)
CO2: 21 mmol/L — ABNORMAL LOW (ref 22–32)
Calcium: 8.7 mg/dL — ABNORMAL LOW (ref 8.9–10.3)
Creatinine, Ser: 1.14 mg/dL (ref 0.61–1.24)
GFR calc Af Amer: >60 mL/min (ref >60)
GFR calc non Af Amer: >60 mL/min (ref >60)
GLUCOSE: 138 mg/dL — AB (ref 70–99)
POTASSIUM: 4 mmol/L (ref 3.5–5.1)
SODIUM: 139 mmol/L (ref 135–145)

## 2018-06-15 LAB — CBC WITH DIFFERENTIAL/PLATELET
Basophils Absolute: 0 10*3/uL (ref 0.0–0.1)
Basophils Relative: 0 %
Eosinophils Absolute: 0.3 10*3/uL (ref 0.0–0.7)
Eosinophils Relative: 3 %
HCT: 50.5 % (ref 39.0–52.0)
HEMOGLOBIN: 17.6 g/dL — AB (ref 13.0–17.0)
LYMPHS ABS: 2.1 10*3/uL (ref 0.7–4.0)
LYMPHS PCT: 20 %
MCH: 31.5 pg (ref 26.0–34.0)
MCHC: 34.9 g/dL (ref 30.0–36.0)
MCV: 90.5 fL (ref 78.0–100.0)
Monocytes Absolute: 1 10*3/uL (ref 0.1–1.0)
Monocytes Relative: 10 %
NEUTROS ABS: 6.8 10*3/uL (ref 1.7–7.7)
Neutrophils Relative %: 67 %
Platelets: 230 10*3/uL (ref 150–400)
RBC: 5.58 MIL/uL (ref 4.22–5.81)
RDW: 13 % (ref 11.5–15.5)
WBC: 10.2 10*3/uL (ref 4.0–10.5)

## 2018-06-15 LAB — GLUCOSE, CAPILLARY: GLUCOSE-CAPILLARY: 131 mg/dL — AB (ref 70–99)

## 2018-06-16 ENCOUNTER — Encounter (HOSPITAL_COMMUNITY)
Admission: RE | Disposition: A | Discharge status: Home / Self Care | Payer: Self-pay | Source: Ambulatory Visit | Attending: Podiatry

## 2018-06-16 ENCOUNTER — Encounter (HOSPITAL_COMMUNITY): Payer: Self-pay | Admitting: Anesthesiology

## 2018-06-16 ENCOUNTER — Ambulatory Visit (HOSPITAL_COMMUNITY): Payer: BLUE CROSS/BLUE SHIELD | Admitting: Anesthesiology

## 2018-06-16 ENCOUNTER — Ambulatory Visit (HOSPITAL_COMMUNITY)
Admission: RE | Admit: 2018-06-16 | Discharge: 2018-06-16 | Disposition: A | Discharge status: Home / Self Care | Payer: BLUE CROSS/BLUE SHIELD | Source: Ambulatory Visit | Attending: Podiatry | Admitting: Podiatry

## 2018-06-16 ENCOUNTER — Ambulatory Visit (HOSPITAL_COMMUNITY): Admission: RE | Admit: 2018-06-16 | Payer: BLUE CROSS/BLUE SHIELD | Source: Ambulatory Visit

## 2018-06-16 DIAGNOSIS — Z87891 Personal history of nicotine dependence: Secondary | ICD-10-CM | POA: Diagnosis not present

## 2018-06-16 DIAGNOSIS — K219 Gastro-esophageal reflux disease without esophagitis: Secondary | ICD-10-CM | POA: Diagnosis not present

## 2018-06-16 DIAGNOSIS — Z9889 Other specified postprocedural states: Secondary | ICD-10-CM

## 2018-06-16 DIAGNOSIS — I10 Essential (primary) hypertension: Secondary | ICD-10-CM | POA: Insufficient documentation

## 2018-06-16 DIAGNOSIS — L97526 Non-pressure chronic ulcer of other part of left foot with bone involvement without evidence of necrosis: Secondary | ICD-10-CM | POA: Diagnosis not present

## 2018-06-16 DIAGNOSIS — F419 Anxiety disorder, unspecified: Secondary | ICD-10-CM | POA: Insufficient documentation

## 2018-06-16 DIAGNOSIS — M869 Osteomyelitis, unspecified: Secondary | ICD-10-CM | POA: Diagnosis not present

## 2018-06-16 DIAGNOSIS — L97529 Non-pressure chronic ulcer of other part of left foot with unspecified severity: Secondary | ICD-10-CM

## 2018-06-16 DIAGNOSIS — L03032 Cellulitis of left toe: Secondary | ICD-10-CM | POA: Diagnosis not present

## 2018-06-16 HISTORY — PX: AMPUTATION: SHX166

## 2018-06-16 LAB — HEMOGLOBIN A1C
Hgb A1c MFr Bld: 5.3 % (ref 4.8–5.6)
Mean Plasma Glucose: 105 mg/dL

## 2018-06-16 SURGERY — AMPUTATION, FOOT, RAY
Anesthesia: Monitor Anesthesia Care | Laterality: Left

## 2018-06-16 MED ORDER — PROMETHAZINE HCL 25 MG/ML IJ SOLN: 6.2500 mg | INTRAMUSCULAR | Status: DC | PRN

## 2018-06-16 MED ORDER — CHLORHEXIDINE GLUCONATE CLOTH 2 % EX PADS: 6.0000 | MEDICATED_PAD | Freq: Once | CUTANEOUS | Status: DC

## 2018-06-16 MED ORDER — BUPIVACAINE HCL (PF) 0.5 % IJ SOLN
INTRAMUSCULAR | Status: AC
  Filled 2018-06-16: qty 30

## 2018-06-16 MED ORDER — 0.9 % SODIUM CHLORIDE (POUR BTL) OPTIME
TOPICAL | Status: DC | PRN
  Administered 2018-06-16: 100 mL

## 2018-06-16 MED ORDER — PROPOFOL 10 MG/ML IV BOLUS
INTRAVENOUS | Status: AC
  Filled 2018-06-16: qty 20

## 2018-06-16 MED ORDER — LACTATED RINGERS IV SOLN
INTRAVENOUS | Status: DC
  Administered 2018-06-16: 1000 mL via INTRAVENOUS

## 2018-06-16 MED ORDER — LACTATED RINGERS IV SOLN: INTRAVENOUS | Status: DC

## 2018-06-16 MED ORDER — PROPOFOL 10 MG/ML IV BOLUS
INTRAVENOUS | Status: DC | PRN
  Administered 2018-06-16: 20 mg via INTRAVENOUS
  Administered 2018-06-16: 30 mg via INTRAVENOUS

## 2018-06-16 MED ORDER — FENTANYL CITRATE (PF) 100 MCG/2ML IJ SOLN
INTRAMUSCULAR | Status: AC
  Filled 2018-06-16: qty 2

## 2018-06-16 MED ORDER — LIDOCAINE HCL (PF) 1 % IJ SOLN
INTRAMUSCULAR | Status: DC | PRN
  Administered 2018-06-16: 10 mL

## 2018-06-16 MED ORDER — HYDROCODONE-ACETAMINOPHEN 7.5-325 MG PO TABS
1.0000 | ORAL_TABLET | Freq: Once | ORAL | Status: AC | PRN
  Administered 2018-06-16: 1 via ORAL
  Filled 2018-06-16: qty 1

## 2018-06-16 MED ORDER — CLINDAMYCIN PHOSPHATE 900 MG/50ML IV SOLN
900.0000 mg | INTRAVENOUS | Status: AC
  Administered 2018-06-16: 900 mg via INTRAVENOUS

## 2018-06-16 MED ORDER — CLINDAMYCIN PHOSPHATE 600 MG/50ML IV SOLN
600.0000 mg | Freq: Once | INTRAVENOUS | Status: DC
  Filled 2018-06-16: qty 50

## 2018-06-16 MED ORDER — MEPERIDINE HCL 50 MG/ML IJ SOLN: 6.2500 mg | INTRAMUSCULAR | Status: DC | PRN

## 2018-06-16 MED ORDER — FENTANYL CITRATE (PF) 100 MCG/2ML IJ SOLN
INTRAMUSCULAR | Status: DC | PRN
  Administered 2018-06-16: 25 ug via INTRAVENOUS

## 2018-06-16 MED ORDER — PROPOFOL 500 MG/50ML IV EMUL
INTRAVENOUS | Status: DC | PRN
  Administered 2018-06-16: 150 ug/kg/min via INTRAVENOUS

## 2018-06-16 MED ORDER — LIDOCAINE HCL (PF) 1 % IJ SOLN
INTRAMUSCULAR | Status: AC
  Filled 2018-06-16: qty 30

## 2018-06-16 MED ORDER — HYDROMORPHONE HCL 1 MG/ML IJ SOLN: 0.2500 mg | INTRAMUSCULAR | Status: DC | PRN

## 2018-06-16 MED ORDER — BUPIVACAINE HCL (PF) 0.5 % IJ SOLN
INTRAMUSCULAR | Status: DC | PRN
  Administered 2018-06-16: 10 mL

## 2018-06-16 MED ORDER — MIDAZOLAM HCL 5 MG/5ML IJ SOLN
INTRAMUSCULAR | Status: DC | PRN
  Administered 2018-06-16: 2 mg via INTRAVENOUS

## 2018-06-16 MED ORDER — CLINDAMYCIN PHOSPHATE 900 MG/50ML IV SOLN
INTRAVENOUS | Status: AC
  Filled 2018-06-16: qty 50

## 2018-06-16 MED ORDER — MIDAZOLAM HCL 2 MG/2ML IJ SOLN
INTRAMUSCULAR | Status: AC
  Filled 2018-06-16: qty 2

## 2018-06-16 SURGICAL SUPPLY — 38 items
BANDAGE ACE 4X5 VEL STRL LF (GAUZE/BANDAGES/DRESSINGS) ×3 IMPLANT
BANDAGE ESMARK 4X12 BL STRL LF (DISPOSABLE) ×1 IMPLANT
BANDAGE GAUZE ELAST BULKY 4 IN (GAUZE/BANDAGES/DRESSINGS) ×3 IMPLANT
BLADE SURG 15 STRL LF DISP TIS (BLADE) ×2 IMPLANT
BLADE SURG 15 STRL SS (BLADE) ×4
BNDG CONFORM 2 STRL LF (GAUZE/BANDAGES/DRESSINGS) ×3 IMPLANT
BNDG ESMARK 4X12 BLUE STRL LF (DISPOSABLE) ×3
BNDG GAUZE ELAST 4 BULKY (GAUZE/BANDAGES/DRESSINGS) ×3 IMPLANT
CLOTH BEACON ORANGE TIMEOUT ST (SAFETY) ×3 IMPLANT
COVER LIGHT HANDLE STERIS (MISCELLANEOUS) ×6 IMPLANT
CUFF TOURNIQUET SINGLE 18IN (TOURNIQUET CUFF) IMPLANT
DECANTER SPIKE VIAL GLASS SM (MISCELLANEOUS) ×3 IMPLANT
DRSG ADAPTIC 3X8 NADH LF (GAUZE/BANDAGES/DRESSINGS) ×3 IMPLANT
ELECT REM PT RETURN 9FT ADLT (ELECTROSURGICAL) ×3
ELECTRODE REM PT RTRN 9FT ADLT (ELECTROSURGICAL) ×1 IMPLANT
GAUZE SPONGE 4X4 12PLY STRL (GAUZE/BANDAGES/DRESSINGS) ×3 IMPLANT
GAUZE SPONGE 4X4 12PLY STRL LF (GAUZE/BANDAGES/DRESSINGS) ×3 IMPLANT
GLOVE BIO SURGEON STRL SZ7 (GLOVE) ×3 IMPLANT
GLOVE BIO SURGEON STRL SZ7.5 (GLOVE) ×3 IMPLANT
GLOVE BIOGEL PI IND STRL 7.0 (GLOVE) ×2 IMPLANT
GLOVE BIOGEL PI INDICATOR 7.0 (GLOVE) ×4
GOWN STRL REUS W/TWL LRG LVL3 (GOWN DISPOSABLE) ×6 IMPLANT
KIT TURNOVER KIT A (KITS) ×3 IMPLANT
MARKER SKIN DUAL TIP RULER LAB (MISCELLANEOUS) ×3 IMPLANT
NEEDLE HYPO 25X1 1.5 SAFETY (NEEDLE) ×6 IMPLANT
NS IRRIG 1000ML POUR BTL (IV SOLUTION) ×3 IMPLANT
PACK BASIC LIMB (CUSTOM PROCEDURE TRAY) ×3 IMPLANT
PAD ARMBOARD 7.5X6 YLW CONV (MISCELLANEOUS) ×3 IMPLANT
PAD TELFA 3X4 1S STER (GAUZE/BANDAGES/DRESSINGS) IMPLANT
SET BASIN LINEN APH (SET/KITS/TRAYS/PACK) ×3 IMPLANT
SOL PREP PROV IODINE SCRUB 4OZ (MISCELLANEOUS) ×3 IMPLANT
SPONGE LAP 18X18 X RAY DECT (DISPOSABLE) ×3 IMPLANT
SUT ETHILON 3 0 FSL (SUTURE) IMPLANT
SUT PROLENE 4 0 PS 2 18 (SUTURE) ×3 IMPLANT
SUT VIC AB 4-0 PS2 27 (SUTURE) IMPLANT
SYR CONTROL 10ML LL (SYRINGE) ×6 IMPLANT
TAPE CLOTH SURG 4X10 WHT LF (GAUZE/BANDAGES/DRESSINGS) ×3 IMPLANT
TOWEL OR 17X26 4PK STRL BLUE (TOWEL DISPOSABLE) ×3 IMPLANT

## 2018-06-16 NOTE — Anesthesia Postprocedure Evaluation (Signed)
Anesthesia Post Note  Patient: Jacob Munoz  Procedure(s) Performed: PARTIAL TOE AMPUTATION - LEFT SECOND TOE (Left )  Patient location during evaluation: PACU Anesthesia Type: MAC Level of consciousness: awake and alert and oriented Pain management: pain level controlled Vital Signs Assessment: post-procedure vital signs reviewed and stable Respiratory status: spontaneous breathing Cardiovascular status: blood pressure returned to baseline and stable Postop Assessment: no apparent nausea or vomiting Anesthetic complications: no     Last Vitals:  Vitals:   06/16/18 1230 06/16/18 1345  BP:  131/83  Pulse:  81  Resp: 17 18  Temp:  36.5 C  SpO2: 98% 92%    Last Pain:  Vitals:   06/16/18 1215  PainSc: 0-No pain                 Delois Silvester

## 2018-06-16 NOTE — Brief Op Note (Signed)
06/16/2018  1:43 PM  PATIENT:  Jacob Munoz  57 y.o. 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  PROCEDURE:  Procedure(s): PARTIAL TOE AMPUTATION - LEFT SECOND TOE (Left)  SURGEON:  Surgeon(s) and Role:    * Ebunoluwa Gernert, DPM - Primary   ANESTHESIA:   local and MAC  EBL:  0 mL   BLOOD ADMINISTERED:none  DRAINS: none   LOCAL MEDICATIONS USED:  MARCAINE   , LIDOCAINE  and Amount: 10 ml  SPECIMEN:  Excision  DISPOSITION OF SPECIMEN:  PATHOLOGY  COUNTS:  YES  TOURNIQUET:   Total Tourniquet Time Documented: Calf (Left) - 28 minutes Total: Calf (Left) - 28 minutes   DICTATION: .Viviann Spare Dictation  PLAN OF CARE: Discharge to home after PACU  PATIENT DISPOSITION:  PACU - hemodynamically stable.   Delay start of Pharmacological VTE agent (>24hrs) due to surgical blood loss or risk of bleeding: N/A

## 2018-06-16 NOTE — Discharge Instructions (Signed)

## 2018-06-16 NOTE — H&P (Signed)
.  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  The various methods of treatment have been discussed with the patient.  No guarantees were given.  After consideration of risks, benefits and other options for treatment, the patient has consented to surgery.  I have reviewed the patients' chart and labs.    No data found.  A history and physical examination was performed in my office.  The patient was reexamined.  There have been no changes to this history and physical examination.  Tyson Babinski, DPM.

## 2018-06-16 NOTE — Transfer of Care (Signed)
Immediate Anesthesia Transfer of Care Note  Patient: Jacob Munoz  Procedure(s) Performed: PARTIAL TOE AMPUTATION - LEFT SECOND TOE (Left )  Patient Location: PACU  Anesthesia Type:MAC  Level of Consciousness: awake and alert   Airway & Oxygen Therapy: Patient Spontanous Breathing  Post-op Assessment: Report given to RN  Post vital signs: Reviewed and stable  Last Vitals:  Vitals Value Taken Time  BP    Temp    Pulse 81 06/16/2018  1:46 PM  Resp 18 06/16/2018  1:46 PM  SpO2 92 % 06/16/2018  1:46 PM  Vitals shown include unvalidated device data.  Last Pain:  Vitals:   06/16/18 1215  PainSc: 0-No pain         Complications: No apparent anesthesia complications

## 2018-06-16 NOTE — Anesthesia Preprocedure Evaluation (Signed)
Anesthesia Evaluation  Patient identified by MRN, date of birth, ID band Patient awake    Reviewed: Allergy & Precautions, H&P , NPO status , Patient's Chart, lab work & pertinent test results, reviewed documented beta blocker date and time   Airway Mallampati: II  TM Distance: >3 FB Neck ROM: full    Dental no notable dental hx. (+) Teeth Intact, Dental Advidsory Given   Pulmonary neg pulmonary ROS, former smoker,    Pulmonary exam normal breath sounds clear to auscultation       Cardiovascular Exercise Tolerance: Good hypertension, On Medications negative cardio ROS   Rhythm:regular Rate:Normal     Neuro/Psych PSYCHIATRIC DISORDERS Anxiety Depression negative neurological ROS  negative psych ROS   GI/Hepatic negative GI ROS, Neg liver ROS, GERD  ,  Endo/Other  negative endocrine ROS  Renal/GU negative Renal ROS  negative genitourinary   Musculoskeletal   Abdominal   Peds  Hematology negative hematology ROS (+)   Anesthesia Other Findings Pleasant, denies DM   Reproductive/Obstetrics negative OB ROS                             Anesthesia Physical Anesthesia Plan  ASA: II  Anesthesia Plan: MAC   Post-op Pain Management:    Induction:   PONV Risk Score and Plan:   Airway Management Planned:   Additional Equipment:   Intra-op Plan:   Post-operative Plan:   Informed Consent: I have reviewed the patients History and Physical, chart, labs and discussed the procedure including the risks, benefits and alternatives for the proposed anesthesia with the patient or authorized representative who has indicated his/her understanding and acceptance.   Dental Advisory Given  Plan Discussed with: CRNA and Anesthesiologist  Anesthesia Plan Comments:         Anesthesia Quick Evaluation

## 2018-06-16 NOTE — Addendum Note (Signed)
Addendum  created 06/16/18 1432 by Ollen Bowl, CRNA   Charge Capture section accepted

## 2018-06-16 NOTE — Op Note (Signed)
06/16/2018  1:43 PM  PATIENT:  Jacob Munoz  57 y.o. 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  PROCEDURE:  Procedure(s): PARTIAL TOE AMPUTATION - LEFT SECOND TOE (Left)  SURGEON:  Surgeon(s) and Role:    * Geri Hepler, DPM - Primary   ANESTHESIA:   local and MAC  EBL:  0 mL   BLOOD ADMINISTERED:none  DRAINS: none   LOCAL MEDICATIONS USED:  MARCAINE   , LIDOCAINE  and Amount: 10 ml  SPECIMEN:  Excision  DISPOSITION OF SPECIMEN:  PATHOLOGY  COUNTS:  YES  TOURNIQUET:   Total Tourniquet Time Documented: Calf (Left) - 28 minutes Total: Calf (Left) - 28 minutes   DICTATION: .Dragon Dictation  PLAN OF CARE: Discharge to home after PACU  Patient was brought into the operating room laid supine on the operating table. Ankle tourniquet was applied to the surgical extremity. Following IV sedation, a local block was achieved using 10 cc of mixture of 1% plain lidocaine with 0.5% marcaine. The foot was the prepped, scrubbed and draped in aseptic manner. The left foot was elevated for 3 minute and the tourniquet on the surgical site was inflatted at 277mHG.   Attention was directed towards the left 2nd toe. There is chronic ulceration noted at the tip of the left 2nd toe. Ulcer probes to bone and there is some drainage with malodor. There is localized redness noted to the level of MPJ. The redness has improved in the past two days.   A racket type incision was planned. The toe was disarticulated at the DIPJ and was removed. The left 2nd toe will be sent to pathology for evaluation. The head of the proximal phalanx and middle phalanx was isolated and removed and sent for pathology for clean margin evaluation. Deep wound cultures were obtained. Wound was flushed. The sharp edges on the remaining proximal phalanx  was rasp down. Excess soft tissue and skin was removed. The incision was closed using 3-0 Prolene. Dry sterile dressing was applied.  Patient to be partial weightbearing in surgical shoe. He is instructed to take his antibiotics as prescribed. Follow up with me in 2 days.

## 2018-06-17 ENCOUNTER — Encounter (HOSPITAL_COMMUNITY): Payer: Self-pay | Admitting: Podiatry

## 2018-06-20 LAB — AEROBIC/ANAEROBIC CULTURE W GRAM STAIN (SURGICAL/DEEP WOUND)

## 2018-06-20 LAB — AEROBIC/ANAEROBIC CULTURE (SURGICAL/DEEP WOUND): GRAM STAIN: NONE SEEN

## 2019-03-07 ENCOUNTER — Telehealth: Payer: Self-pay | Admitting: Diagnostic Neuroimaging

## 2019-03-07 ENCOUNTER — Encounter: Payer: Self-pay | Admitting: Diagnostic Neuroimaging

## 2019-03-07 NOTE — Addendum Note (Signed)
Addended by: Minna Antis on: 03/07/2019 03:38 PM   Modules accepted: Orders

## 2019-03-07 NOTE — Telephone Encounter (Signed)
Spoke with patient and updated EMR. 

## 2019-03-07 NOTE — Telephone Encounter (Signed)
Pt gave consent for VV on the phone/ Pt understands that although there may be some limitations with this type of visit, we will take all precautions to reduce any security or privacy concerns.  Pt understands that this will be treated like an in office visit and we will file with pt's insurance, and there may be a patient responsible charge related to this service. Sent email with link to [email or web address]

## 2019-03-08 ENCOUNTER — Other Ambulatory Visit: Payer: Self-pay

## 2019-03-08 ENCOUNTER — Ambulatory Visit (INDEPENDENT_AMBULATORY_CARE_PROVIDER_SITE_OTHER): Payer: BC Managed Care – PPO | Admitting: Diagnostic Neuroimaging

## 2019-03-08 ENCOUNTER — Encounter: Payer: Self-pay | Admitting: Diagnostic Neuroimaging

## 2019-03-08 DIAGNOSIS — G629 Polyneuropathy, unspecified: Secondary | ICD-10-CM

## 2019-03-08 DIAGNOSIS — R200 Anesthesia of skin: Secondary | ICD-10-CM | POA: Diagnosis not present

## 2019-03-08 NOTE — Progress Notes (Signed)
[facility]  PATIENT: Jacob Munoz DOB: [birth year]  REFERRING CLINICIAN: K Lingle. MD HISTORY FROM: patient REASON FOR VISIT: new consult   HISTORICAL  CHIEF COMPLAINT:  Chief Complaint  Patient presents with  . Numbness    HISTORY OF PRESENT ILLNESS:   58 year old male with history of pheochromocytoma, hypertension, obesity, sleep apnea, here for evaluation of neuropathy.  2010 patient had onset of numbness and tingling in his toes and feet.  He also had a burning sensation in his feet.  He was evaluated in 2013 by neurologist and was diagnosed with neuropathy, without specific cause.  History of gabapentin and nortriptyline without relief.  Gabapentin caused dizziness.  Nortriptyline increased burning sensation and numbness.  Patient has had multiple injuries to his toes and feet requiring multiple podiatry procedures and surgeries.  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  Currently patient having numbness and tingling toes and feet only.  No burning or pain sensations.  He does have low back pain radiating to his legs.  Previously diagnosed with sciatica.  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  Patient takes precautions.  No major falls.   REVIEW OF SYSTEMS: Full 14 system review of systems performed and negative with exception of: As per HPI.   ALLERGIES: Allergies  Allergen Reactions  . Lisinopril Cough    HOME MEDICATIONS: Outpatient Medications Prior to Visit  Medication Sig Dispense Refill  . acetaminophen (TYLENOL) 500 MG tablet Take 500-1,000 mg by mouth every 6 (six) hours as needed (for pain.).    Marland Kitchen buPROPion (WELLBUTRIN SR) 150 MG 12 hr tablet Take 150 mg by mouth 2 (two) times daily.    . Chromium Picolinate 1000 MCG TABS Take by mouth.    Marland Kitchen ibuprofen (ADVIL,MOTRIN) 200 MG tablet Take 200-400 mg by mouth 2 (two) times daily as needed (for pain/numbness).     Marland Kitchen  L-ARGININE-500 PO Take 1 tablet by mouth daily.    Marland Kitchen levOCARNitine (L-CARNITINE) 500 MG TABS Take 1 tablet by mouth daily.    . Melatonin 5 MG TABS Take 5 mg by mouth at bedtime as needed (sleep).    . nebivolol (BYSTOLIC) 10 MG tablet Take 10 mg by mouth 2 (two) times a day.    . olmesartan (BENICAR) 20 MG tablet Take 20 mg by mouth 2 (two) times a day.    Marland Kitchen omeprazole (PRILOSEC) 40 MG capsule Take 40 mg by mouth daily as needed (for acid reflux/heartburn).    . Potassium 99 MG TABS Take 99 mg by mouth daily. May take an additional dose in the evening if experiencing cramping    . UNABLE TO FIND Med Name: hydrangea root to help prevent or dissolve kidney stones    . vitamin C (ASCORBIC ACID) 500 MG tablet Take 500 mg by mouth daily as needed (Cold season).      No facility-administered medications prior to visit.     PAST MEDICAL HISTORY: Past Medical History:  Diagnosis Date  . Adrenal gland neoplasm   . Anxiety   . Coronary atherosclerosis   . Depression   . GERD (gastroesophageal reflux disease)   . Gout   . History of kidney stones   . Hyperlipidemia   . Hypertension   . Neuropathy   . Peripheral venous insufficiency   . Sleep apnea     PAST SURGICAL HISTORY: Past Surgical History:  Procedure Laterality Date  . ADRENALECTOMY Left 2015  tumors removed  . AMPUTATION Left 06/16/2018   Procedure: PARTIAL TOE AMPUTATION - LEFT SECOND TOE;  Surgeon: Tyson Babinski, DPM;  Location: [facility];  Service: Podiatry;  Laterality: Left;  . DISTAL INTERPHALANGEAL JOINT FUSION Right 07/23/2016   Procedure: RIGHT 1ST INTERPHALANGEAL JOINT FUSION;  Surgeon: Tyson Babinski, DPM;  Location: [facility];  Service: Podiatry;  Laterality: Right;  . REPAIR EXTENSOR TENDON Right 07/23/2016   Procedure: EXTENSOR TENDON LENGTHENING;  Surgeon: Tyson Babinski, DPM;  Location: [facility];  Service: Podiatry;  Laterality: Right;  . SALIVARY GLAND SURGERY Left 12/2015  . TONSILLECTOMY    .  TONSILLECTOMY AND ADENOIDECTOMY      FAMILY HISTORY: Family History  Problem Relation Age of Onset  . Colon cancer Mother   . Diabetes Father        diabetic coma  . Heart attack Brother     SOCIAL HISTORY: Social History   Socioeconomic History  . Marital status: Divorced    Spouse name: Not on file  . Number of children: 4  . Years of education: Not on file  . Highest education level: High school graduate  Occupational History    Comment: veteran   Social Needs  . Financial resource strain: Not on file  . Food insecurity    Worry: Not on file    Inability: Not on file  . Transportation needs    Medical: Not on file    Non-medical: Not on file  Tobacco Use  . Smoking status: Former Smoker    Packs/day: 0.50    Years: 40.00    Pack years: 20.00    Types: Cigarettes, Cigars    Quit date: 05/21/2016    Years since quitting: 2.7  . Smokeless tobacco: Never Used  Substance and Sexual Activity  . Alcohol use: Yes    Comment: occassional  . Drug use: No  . Sexual activity: Yes  Lifestyle  . Physical activity    Days per week: Not on file    Minutes per session: Not on file  . Stress: Not on file  Relationships  . Social Herbalist on phone: Not on file    Gets together: Not on file    Attends religious service: Not on file    Active member of club or organization: Not on file    Attends meetings of clubs or organizations: Not on file    Relationship status: Not on file  . Intimate partner violence    Fear of current or ex partner: Not on file    Emotionally abused: Not on file    Physically abused: Not on file    Forced sexual activity: Not on file  Other Topics Concern  . Not on file  Social History Narrative   Caffeine- maybe 20 oz soda a day     PHYSICAL EXAM  VIDEO EXAM  GENERAL EXAM/CONSTITUTIONAL:  Vitals: There were no vitals filed for this visit.  There is no height or weight on file to calculate BMI. Wt Readings from Last 3  Encounters:  06/15/18 211 lb (95.7 kg)  07/21/16 246 lb (111.6 kg)     Patient is in no distress; well developed, nourished and groomed; neck is supple   NEUROLOGIC: MENTAL STATUS:  No flowsheet data found.  awake, alert, oriented to person, place and time  recent and remote memory intact  normal attention and concentration  language fluent, comprehension intact, naming intact  fund of knowledge appropriate  CRANIAL NERVE:  2nd, 3rd, 4th, 6th - visual fields full to confrontation, extraocular muscles intact, no nystagmus  5th - facial sensation symmetric  7th - facial strength symmetric  8th - hearing intact  11th - shoulder shrug symmetric  12th - tongue protrusion midline  MOTOR:   NO TREMOR; NO DRIFT IN BUE  SENSORY:   normal and symmetric to light touch  COORDINATION:   fine finger movements normal      DIAGNOSTIC DATA (LABS, IMAGING, TESTING) - I reviewed patient records, labs, notes, testing and imaging myself where available.  Lab Results  Component Value Date   WBC 10.2 06/15/2018   HGB 17.6 (H) 06/15/2018   HCT 50.5 06/15/2018   MCV 90.5 06/15/2018   PLT 230 06/15/2018      Component Value Date/Time   NA 139 06/15/2018 1321   K 4.0 06/15/2018 1321   CL 109 06/15/2018 1321   CO2 21 (L) 06/15/2018 1321   GLUCOSE 138 (H) 06/15/2018 1321   BUN 21 (H) 06/15/2018 1321   CREATININE 1.14 06/15/2018 1321   CALCIUM 8.7 (L) 06/15/2018 1321   GFRNONAA >60 06/15/2018 1321   GFRAA >60 06/15/2018 1321   No results found for: CHOL, HDL, LDLCALC, LDLDIRECT, TRIG, CHOLHDL Lab Results  Component Value Date   HGBA1C 5.3 06/15/2018   No results found for: VITAMINB12 No results found for: TSH     ASSESSMENT AND PLAN  58 y.o. year old male here with numbness in feet since 2010.   Ddx: neuropathy (obesity, autoimmune, inflamm, metabolic, hereditary, paraneoplastic), lumbar spinal stenosis, lumbar radiculopathies  1. Numbness in feet     Virtual Visit via Video Note  I connected with Jacob Munoz on 03/08/19 at 11:30 AM EDT by a video enabled telemedicine application and verified that I am speaking with the correct person using two identifiers.  Location: Patient: home Provider: office   I discussed the limitations of evaluation and management by telemedicine and the availability of in person appointments. The patient expressed understanding and agreed to proceed.   I discussed the assessment and treatment plan with the patient. The patient was provided an opportunity to ask questions and all were answered. The patient agreed with the plan and demonstrated an understanding of the instructions.   The patient was advised to call back or seek an in-person evaluation if the symptoms worsen or if the condition fails to improve as anticipated.  I provided 30 minutes of non-face-to-face time during this encounter.   PLAN:  NUMBNESS IN FEET: ddx: neuropathy (obesity, autoimmune, inflamm, metabolic, hereditary, paraneoplastic), lumbar spinal stenosis, lumbar radiculopathies - check MRI lumbar spine; then may consider EMG/NCS and neuropathy labs - consider PT evaluation for balance and gait training  Orders Placed This Encounter  Procedures  . MR LUMBAR SPINE WO CONTRAST   Return pending test results, for pending if symptoms worsen or fail to improve.    Penni Bombard, MD 9/56/3875, 64:33 AM Certified in Neurology, Neurophysiology and Neuroimaging  [facility] [address], [place], [place] [address] [phone]

## 2019-03-10 ENCOUNTER — Telehealth: Payer: Self-pay | Admitting: Diagnostic Neuroimaging

## 2019-03-10 NOTE — Telephone Encounter (Signed)
[employer] Auth: [id number] (exp. 03/10/19 to 04/08/19) patient is scheduled at [facility] for 04/02/19.

## 2019-03-26 ENCOUNTER — Other Ambulatory Visit: Payer: Self-pay

## 2019-03-26 ENCOUNTER — Ambulatory Visit
Admission: RE | Admit: 2019-03-26 | Discharge: 2019-03-26 | Disposition: A | Discharge status: Home / Self Care | Payer: BLUE CROSS/BLUE SHIELD | Source: Ambulatory Visit | Attending: Diagnostic Neuroimaging | Admitting: Diagnostic Neuroimaging

## 2019-03-26 DIAGNOSIS — R200 Anesthesia of skin: Secondary | ICD-10-CM | POA: Diagnosis not present

## 2019-03-29 ENCOUNTER — Telehealth: Payer: Self-pay | Admitting: *Deleted

## 2019-03-29 DIAGNOSIS — G629 Polyneuropathy, unspecified: Secondary | ICD-10-CM

## 2019-03-29 NOTE — Telephone Encounter (Signed)
LVM informing patient that his MRI lumbar spine showed unremarkable imaging results. I advised Dr Leta Baptist stated he may consider EMG, neuropathy labs, PT evaluation. Left number for questions.

## 2019-03-29 NOTE — Telephone Encounter (Signed)
Patient called back stating his neuropathy is getting worse. He would like to have further testing. He stated his job requires he be on his feet, and he wants to feel safe. He would like to have labs and NCS. I advised will let Dr Leta Baptist know and call him back. He  verbalized understanding, appreciation.

## 2019-03-30 NOTE — Addendum Note (Signed)
Addended by: Andrey Spearman R on: 03/30/2019 03:36 PM   Modules accepted: Orders

## 2019-03-30 NOTE — Telephone Encounter (Signed)
I ordered EMG. Then may consider labs when he comes in for test. -VRP

## 2019-03-30 NOTE — Telephone Encounter (Signed)
Called patient and informed him Dr Leta Baptist has ordered EMG/NCS. Afterwards he will determine if labs are needed. If so Dr Leta Baptist will order and he can have them drawn before he leaves the office. I advised Jacob Munoz will call him and scheduled tests. He verbalized understanding, appreciation.

## 2019-04-02 ENCOUNTER — Other Ambulatory Visit: Payer: BLUE CROSS/BLUE SHIELD

## 2019-04-04 ENCOUNTER — Telehealth: Payer: Self-pay | Admitting: Diagnostic Neuroimaging

## 2019-04-04 NOTE — Telephone Encounter (Signed)
I called patient and LVM to schedule NCV/EMG ordered by Dr. Leta Baptist. Requested patient call back to schedule.

## 2019-05-17 ENCOUNTER — Ambulatory Visit: Payer: BC Managed Care – PPO | Admitting: Neurology

## 2019-05-17 ENCOUNTER — Encounter

## 2019-05-17 ENCOUNTER — Encounter: Payer: Self-pay | Admitting: Neurology

## 2019-05-17 ENCOUNTER — Ambulatory Visit (INDEPENDENT_AMBULATORY_CARE_PROVIDER_SITE_OTHER): Payer: BC Managed Care – PPO | Admitting: Neurology

## 2019-05-17 ENCOUNTER — Other Ambulatory Visit: Payer: Self-pay

## 2019-05-17 DIAGNOSIS — G609 Hereditary and idiopathic neuropathy, unspecified: Secondary | ICD-10-CM

## 2019-05-17 DIAGNOSIS — G629 Polyneuropathy, unspecified: Secondary | ICD-10-CM

## 2019-05-17 HISTORY — DX: Polyneuropathy, unspecified: G62.9

## 2019-05-17 NOTE — Progress Notes (Signed)
Please refer to EMG and nerve conduction procedure note.  

## 2019-05-17 NOTE — Procedures (Signed)
     HISTORY:  Jacob Munoz is a 58 year old gentleman with a history of some numbness in the feet over the last 15 years.  This issue has gradually worsened and has started to affect his balance.  He is being evaluated for possible neuropathy or a lumbosacral radiculopathy.  NERVE CONDUCTION STUDIES:  Nerve conduction studies were performed on both lower extremities.  The distal motor latencies for the peroneal and posterior tibial nerves were normal bilaterally with low motor amplitudes for the posterior tibial nerves bilaterally and for the left peroneal nerve.  Motor amplitude for the right peroneal nerve was normal.  The nerve conduction velocities for the peroneal and posterior tibial nerves are slowed bilaterally.  No response was seen for the left sural nerve and for the peroneal nerves bilaterally, a borderline normal sensory latency for the right sural nerve was seen.  The F-wave latencies for the posterior tibial nerves were prolonged bilaterally.  EMG STUDIES:  EMG study was performed on the right lower extremity:  The tibialis anterior muscle reveals 2 to 4K motor units with decreased recruitment. No fibrillations or positive waves were seen. The peroneus tertius muscle reveals 2 to 4K motor units with decreased recruitment. No fibrillations or positive waves were seen. The medial gastrocnemius muscle reveals 1 to 3K motor units with slightly decreased recruitment. No fibrillations or positive waves were seen. The vastus lateralis muscle reveals 2 to 4K motor units with full recruitment. No fibrillations or positive waves were seen. The iliopsoas muscle reveals 2 to 4K motor units with full recruitment. No fibrillations or positive waves were seen. The biceps femoris muscle (long head) reveals 2 to 4K motor units with full recruitment. No fibrillations or positive waves were seen. The lumbosacral paraspinal muscles were tested at 3 levels, and revealed no abnormalities of  insertional activity at all 3 levels tested. There was good relaxation.   IMPRESSION:  Nerve conduction studies done on both lower extremities shows evidence of a primarily axonal peripheral neuropathy of moderate severity.  EMG evaluation of the right lower extremity shows mild chronic stable signs of neuropathic denervation below the knee again consistent with the diagnosis of peripheral neuropathy.  There is no clear evidence of an overlying lumbosacral radiculopathy.  [name] MD 05/17/2019 3:57 PM  [facility] [address] [place], [place] [address]  Phone [phone] Fax [phone]

## 2019-05-31 ENCOUNTER — Telehealth: Payer: Self-pay | Admitting: *Deleted

## 2019-05-31 DIAGNOSIS — G629 Polyneuropathy, unspecified: Secondary | ICD-10-CM

## 2019-05-31 NOTE — Telephone Encounter (Signed)
LVM informing patient that Dr Leta Baptist stated his EMG showed general pattern neuropathy, and he has ordered neuropathy labs to see if there is a treatable cause for his neuropathy. I requested patient call back to let me know if there's a lab in [place] for him to get them done. I advised him the EMG results were faxed to Dr Posey Pronto.

## 2019-05-31 NOTE — Addendum Note (Signed)
Addended by: Andrey Spearman R on: 05/31/2019 01:06 PM   Modules accepted: Orders

## 2019-05-31 NOTE — Telephone Encounter (Signed)
Patient called back and stated he wanted to know what to do to make his neuropathy go away. I advised him Dr Leta Baptist has ordered the labs to determine if there is treatment for it pending results.  I advised him that there's [facility] in [place] and if he agrees the labs can be released so he can have them done there. He stated he wants to go today. I gave him address, phone #. He stated that he was on hydralazine for many years and read that it can lead to neuropathy. He is doing PT through his work for balance improvement and to "help both his feet". He verbalized understanding, appreciation. Ellison Hughs in lab informed to release lab orders; she verbalized understanding.

## 2019-05-31 NOTE — Telephone Encounter (Signed)
EMG shows neuropathy (axonal, general pattern). Will check neuropathy labs.   Orders Placed This Encounter  Procedures  . Hemoglobin A1c  . Vitamin B12  . TSH  . Multiple Myeloma Panel (SPEP&IFE w/QIG)  . ANA,IFA RA Diag Pnl w/rflx Tit/Patn   Penni Bombard, MD 4/58/5929, 24:46 PM Certified in Neurology, Neurophysiology and Neuroimaging  [facility] [address] [place], [place] [address] [phone]

## 2019-05-31 NOTE — Telephone Encounter (Signed)
The patient called and asked if Dr. Leta Baptist had reviewed his EMG and what his thoughts were. The pt stated he has been out of work (first for BP, then for his foot), and he requests input from Dr. 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 He sees Dr. Posey Pronto at [facility] and would like the report sent there. He says at work he has to climb ladders, step on machines, conveyers, blindly step at times and he cannot feel the shape of the surface under his foot. He states he is getting PT for his foot per Dr. Prince Rome whom Dr. Posey Pronto referred him to. Please call to discuss.

## 2019-05-31 NOTE — Addendum Note (Signed)
Addended by: Inis Sizer D on: 05/31/2019 02:06 PM   Modules accepted: Orders

## 2019-06-03 LAB — MULTIPLE MYELOMA PANEL, SERUM
Albumin SerPl Elph-Mcnc: 3.8 g/dL (ref 2.9–4.4)
Albumin/Glob SerPl: 1.3 (ref 0.7–1.7)
Alpha 1: 0.2 g/dL (ref 0.0–0.4)
Alpha2 Glob SerPl Elph-Mcnc: 0.7 g/dL (ref 0.4–1.0)
B-Globulin SerPl Elph-Mcnc: 1 g/dL (ref 0.7–1.3)
Gamma Glob SerPl Elph-Mcnc: 1.1 g/dL (ref 0.4–1.8)
Globulin, Total: 3.1 g/dL (ref 2.2–3.9)
IgA/Immunoglobulin A, Serum: 280 mg/dL (ref 90–386)
IgG (Immunoglobin G), Serum: 1280 mg/dL (ref 603–1613)
IgM (Immunoglobulin M), Srm: 93 mg/dL (ref 20–172)
Total Protein: 6.9 g/dL (ref 6.0–8.5)

## 2019-06-03 LAB — ANA,IFA RA DIAG PNL W/RFLX TIT/PATN
ANA Titer 1: NEGATIVE
Cyclic Citrullin Peptide Ab: 5 units (ref 0–19)
Rhuematoid fact SerPl-aCnc: <10 IU/mL (ref 0.0–13.9)

## 2019-06-03 LAB — HEMOGLOBIN A1C
Est. average glucose Bld gHb Est-mCnc: 120 mg/dL
Hgb A1c MFr Bld: 5.8 % — ABNORMAL HIGH (ref 4.8–5.6)

## 2019-06-03 LAB — TSH: TSH: 1.22 u[IU]/mL (ref 0.450–4.500)

## 2019-06-03 LAB — VITAMIN B12: Vitamin B-12: 471 pg/mL (ref 232–1245)

## 2019-06-06 ENCOUNTER — Telehealth: Payer: Self-pay | Admitting: *Deleted

## 2019-06-08 NOTE — Telephone Encounter (Signed)
Spoke with patient and informed him all labs are okay with borderline high A1c. He asked for its result which I gave, and then stated it is less than his previous of 5.9. I advised Dr Leta Baptist stated his diagnosis is unexplained neuropathy, and advised he continue with PT,OT and the podiatrist. Patient again verbalized concern about his ability to safely perform his job. I suggested he talk with supervisors to work on possible job accommodations or what else they can offer him. Patient verbalized understanding, appreciation.

## 2019-06-08 NOTE — Telephone Encounter (Signed)
Labs ok. A1c is borderline. Continue PT and OT eval. Dx is unexplained (idiopathic) neuropathy, similar to dx from 2010.   Penni Bombard, MD [id number], [id number] AM Certified in Neurology, Neurophysiology and Neuroimaging  [facility] [address] [place], [place] [address] [phone]

## 2019-07-28 ENCOUNTER — Other Ambulatory Visit: Payer: Self-pay

## 2019-07-28 ENCOUNTER — Emergency Department (HOSPITAL_COMMUNITY): Payer: BC Managed Care – PPO

## 2019-07-28 ENCOUNTER — Inpatient Hospital Stay (HOSPITAL_COMMUNITY)
Admission: EM | Admit: 2019-07-28 | Discharge: 2019-07-31 | DRG: 540 | Disposition: A | Discharge status: Home Health | Payer: BC Managed Care – PPO | Attending: Family Medicine | Admitting: Family Medicine

## 2019-07-28 ENCOUNTER — Encounter (HOSPITAL_COMMUNITY): Payer: Self-pay

## 2019-07-28 DIAGNOSIS — Z87891 Personal history of nicotine dependence: Secondary | ICD-10-CM

## 2019-07-28 DIAGNOSIS — T148XXA Other injury of unspecified body region, initial encounter: Secondary | ICD-10-CM

## 2019-07-28 DIAGNOSIS — I2510 Atherosclerotic heart disease of native coronary artery without angina pectoris: Secondary | ICD-10-CM | POA: Diagnosis present

## 2019-07-28 DIAGNOSIS — L97529 Non-pressure chronic ulcer of other part of left foot with unspecified severity: Secondary | ICD-10-CM | POA: Diagnosis present

## 2019-07-28 DIAGNOSIS — K219 Gastro-esophageal reflux disease without esophagitis: Secondary | ICD-10-CM | POA: Diagnosis present

## 2019-07-28 DIAGNOSIS — I872 Venous insufficiency (chronic) (peripheral): Secondary | ICD-10-CM | POA: Diagnosis present

## 2019-07-28 DIAGNOSIS — Z20828 Contact with and (suspected) exposure to other viral communicable diseases: Secondary | ICD-10-CM | POA: Diagnosis present

## 2019-07-28 DIAGNOSIS — G4730 Sleep apnea, unspecified: Secondary | ICD-10-CM | POA: Diagnosis present

## 2019-07-28 DIAGNOSIS — L03116 Cellulitis of left lower limb: Secondary | ICD-10-CM | POA: Diagnosis present

## 2019-07-28 DIAGNOSIS — Z981 Arthrodesis status: Secondary | ICD-10-CM

## 2019-07-28 DIAGNOSIS — Z888 Allergy status to other drugs, medicaments and biological substances status: Secondary | ICD-10-CM

## 2019-07-28 DIAGNOSIS — Z833 Family history of diabetes mellitus: Secondary | ICD-10-CM

## 2019-07-28 DIAGNOSIS — Y92239 Unspecified place in hospital as the place of occurrence of the external cause: Secondary | ICD-10-CM | POA: Diagnosis not present

## 2019-07-28 DIAGNOSIS — Z79890 Hormone replacement therapy (postmenopausal): Secondary | ICD-10-CM

## 2019-07-28 DIAGNOSIS — F411 Generalized anxiety disorder: Secondary | ICD-10-CM | POA: Diagnosis present

## 2019-07-28 DIAGNOSIS — T368X5A Adverse effect of other systemic antibiotics, initial encounter: Secondary | ICD-10-CM | POA: Diagnosis not present

## 2019-07-28 DIAGNOSIS — I10 Essential (primary) hypertension: Secondary | ICD-10-CM | POA: Diagnosis present

## 2019-07-28 DIAGNOSIS — Z791 Long term (current) use of non-steroidal anti-inflammatories (NSAID): Secondary | ICD-10-CM

## 2019-07-28 DIAGNOSIS — E785 Hyperlipidemia, unspecified: Secondary | ICD-10-CM | POA: Diagnosis present

## 2019-07-28 DIAGNOSIS — M109 Gout, unspecified: Secondary | ICD-10-CM | POA: Diagnosis present

## 2019-07-28 DIAGNOSIS — Z800 Family history of malignant neoplasm of digestive organs: Secondary | ICD-10-CM

## 2019-07-28 DIAGNOSIS — R197 Diarrhea, unspecified: Secondary | ICD-10-CM | POA: Diagnosis not present

## 2019-07-28 DIAGNOSIS — F329 Major depressive disorder, single episode, unspecified: Secondary | ICD-10-CM | POA: Diagnosis present

## 2019-07-28 DIAGNOSIS — Z87442 Personal history of urinary calculi: Secondary | ICD-10-CM

## 2019-07-28 DIAGNOSIS — M86172 Other acute osteomyelitis, left ankle and foot: Principal | ICD-10-CM | POA: Diagnosis present

## 2019-07-28 DIAGNOSIS — Z8249 Family history of ischemic heart disease and other diseases of the circulatory system: Secondary | ICD-10-CM

## 2019-07-28 DIAGNOSIS — Z89422 Acquired absence of other left toe(s): Secondary | ICD-10-CM

## 2019-07-28 DIAGNOSIS — L089 Local infection of the skin and subcutaneous tissue, unspecified: Secondary | ICD-10-CM

## 2019-07-28 DIAGNOSIS — Z79899 Other long term (current) drug therapy: Secondary | ICD-10-CM

## 2019-07-28 DIAGNOSIS — G629 Polyneuropathy, unspecified: Secondary | ICD-10-CM | POA: Diagnosis present

## 2019-07-28 HISTORY — DX: Unspecified osteoarthritis, unspecified site: M19.90

## 2019-07-28 LAB — CBC WITH DIFFERENTIAL/PLATELET
Abs Immature Granulocytes: 0.08 10*3/uL — ABNORMAL HIGH (ref 0.00–0.07)
Basophils Absolute: 0.1 10*3/uL (ref 0.0–0.1)
Basophils Relative: 1 %
Eosinophils Absolute: 0.3 10*3/uL (ref 0.0–0.5)
Eosinophils Relative: 2 %
HCT: 52 % (ref 39.0–52.0)
Hemoglobin: 17.2 g/dL — ABNORMAL HIGH (ref 13.0–17.0)
Immature Granulocytes: 1 %
Lymphocytes Relative: 19 %
Lymphs Abs: 2.2 10*3/uL (ref 0.7–4.0)
MCH: 28.9 pg (ref 26.0–34.0)
MCHC: 33.1 g/dL (ref 30.0–36.0)
MCV: 87.2 fL (ref 80.0–100.0)
Monocytes Absolute: 1.2 10*3/uL — ABNORMAL HIGH (ref 0.1–1.0)
Monocytes Relative: 11 %
Neutro Abs: 7.8 10*3/uL — ABNORMAL HIGH (ref 1.7–7.7)
Neutrophils Relative %: 66 %
Platelets: 256 10*3/uL (ref 150–400)
RBC: 5.96 MIL/uL — ABNORMAL HIGH (ref 4.22–5.81)
RDW: 15.2 % (ref 11.5–15.5)
WBC: 11.6 10*3/uL — ABNORMAL HIGH (ref 4.0–10.5)
nRBC: 0 % (ref 0.0–0.2)

## 2019-07-28 LAB — BASIC METABOLIC PANEL
Anion gap: 12 (ref 5–15)
BUN: 25 mg/dL — ABNORMAL HIGH (ref 6–20)
CO2: 24 mmol/L (ref 22–32)
Calcium: 9.3 mg/dL (ref 8.9–10.3)
Chloride: 100 mmol/L (ref 98–111)
Creatinine, Ser: 1.24 mg/dL (ref 0.61–1.24)
GFR calc Af Amer: >60 mL/min (ref >60)
GFR calc non Af Amer: >60 mL/min (ref >60)
Glucose, Bld: 127 mg/dL — ABNORMAL HIGH (ref 70–99)
Potassium: 4.1 mmol/L (ref 3.5–5.1)
Sodium: 136 mmol/L (ref 135–145)

## 2019-07-28 LAB — C-REACTIVE PROTEIN: CRP: 8.6 mg/dL — ABNORMAL HIGH (ref <1.0)

## 2019-07-28 LAB — SEDIMENTATION RATE: Sed Rate: 4 mm/hr (ref 0–16)

## 2019-07-28 NOTE — ED Provider Notes (Signed)
[facility] EMERGENCY DEPARTMENT Provider Note   CSN: [id number] Arrival date & time: 07/28/19  1853     History   Chief Complaint Chief Complaint  Patient presents with  . Wound Infection    left great toe    HPI Jacob Munoz is a 58 y.o. 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 Posey Pronto.  Patient developed a callus on his inferior left great toe approximately 1 month ago which became infected, since that time he is had a chronic infection and swelling of the toe after multiple rounds of antibiotics.  He initially was placed on Cipro and clindamycin, the infection did not respond, he was switched to a course of doxycycline with some improvement but after antibiotic was finished the swelling returned.  He was placed back on Cipro and clindamycin 3 days ago.  After an office visit with Dr. Posey Pronto today he was sent here for admission.  He denies any pain in the toe.  There has been moderate serous and bloody drainage, no obvious purulent drainage from the site.  He has developed erythema and tenderness along his medial lower leg as well.  He denies fevers or chills, nausea, vomiting or any other complaints at this time.     The history is provided by the patient.    Past Medical History:  Diagnosis Date  . Adrenal gland neoplasm   . Anxiety   . Coronary atherosclerosis   . Depression   . GERD (gastroesophageal reflux disease)   . Gout   . History of kidney stones   . Hyperlipidemia   . Hypertension   . Neuropathy   . Peripheral neuropathy 05/17/2019  . Peripheral venous insufficiency   . Sleep apnea     Patient Active Problem List   Diagnosis Date Noted  . Peripheral neuropathy 05/17/2019    Past  Surgical History:  Procedure Laterality Date  . ADRENALECTOMY Left 2015   tumors removed  . AMPUTATION Left 06/16/2018   Procedure: PARTIAL TOE AMPUTATION - LEFT SECOND TOE;  Surgeon: Tyson Babinski, DPM;  Location: [facility];  Service: Podiatry;  Laterality: Left;  . DISTAL INTERPHALANGEAL JOINT FUSION Right 07/23/2016   Procedure: RIGHT 1ST INTERPHALANGEAL JOINT FUSION;  Surgeon: Tyson Babinski, DPM;  Location: [facility];  Service: Podiatry;  Laterality: Right;  . REPAIR EXTENSOR TENDON Right 07/23/2016   Procedure: EXTENSOR TENDON LENGTHENING;  Surgeon: Tyson Babinski, DPM;  Location: [facility];  Service: Podiatry;  Laterality: Right;  . SALIVARY GLAND SURGERY Left 12/2015  . TONSILLECTOMY    . TONSILLECTOMY AND ADENOIDECTOMY          Home Medications    Prior to Admission medications   Medication Sig Start Date End Date Taking? Authorizing Provider  acetaminophen (TYLENOL) 500 MG tablet Take 500-1,000 mg by mouth every 6 (six) hours as needed (for pain.).    [provider]  buPROPion (WELLBUTRIN SR) 150 MG 12 hr tablet Take 150 mg by mouth 2 (two) times daily.    [provider]  carvedilol (COREG) 25 MG tablet Take 25 mg by mouth 2 (two) times daily. 07/21/19   [provider]  Chromium Picolinate 1000 MCG TABS Take by mouth.    [provider]  ciprofloxacin (CIPRO) 500 MG tablet  Take 500 mg by mouth 2 (two) times daily. 07/25/19   [provider]  clindamycin (CLEOCIN) 300 MG capsule Take 300 mg by mouth 3 (three) times daily. 07/25/19   [provider]  doxycycline (VIBRAMYCIN) 100 MG capsule Take 100 mg by mouth 2 (two) times daily. 07/14/19   [provider]  hydrochlorothiazide (HYDRODIURIL) 25 MG tablet Take 25 mg by mouth daily. 07/21/19   [provider]  ibuprofen (ADVIL,MOTRIN) 200 MG tablet Take 200-400 mg by mouth 2 (two) times daily as needed (for pain/numbness).     [provider]   L-ARGININE-500 PO Take 1 tablet by mouth daily.    [provider]  levOCARNitine (L-CARNITINE) 500 MG TABS Take 1 tablet by mouth daily.    [provider]  Melatonin 5 MG TABS Take 5 mg by mouth at bedtime as needed (sleep).    [provider]  nebivolol (BYSTOLIC) 10 MG tablet Take 10 mg by mouth 2 (two) times a day.    [provider]  olmesartan (BENICAR) 20 MG tablet Take 20 mg by mouth 2 (two) times a day.    [provider]  omeprazole (PRILOSEC) 40 MG capsule Take 40 mg by mouth daily as needed (for acid reflux/heartburn).    [provider]  Potassium 99 MG TABS Take 99 mg by mouth daily. May take an additional dose in the evening if experiencing cramping    [provider]  spironolactone (ALDACTONE) 25 MG tablet Take 25 mg by mouth daily. 07/21/19   [provider]  UNABLE TO FIND Med Name: Lorenza Chick root to help prevent or dissolve kidney stones    [provider]  vitamin C (ASCORBIC ACID) 500 MG tablet Take 500 mg by mouth daily as needed (Cold season).     [provider]    Family History Family History  Problem Relation Age of Onset  . Colon cancer Mother   . Diabetes Father        diabetic coma  . Heart attack Brother     Social History Social History   Tobacco Use  . Smoking status: Former Smoker    Packs/day: 0.50    Years: 40.00    Pack years: 20.00    Types: Cigarettes, Cigars    Quit date: 05/21/2016    Years since quitting: 3.1  . Smokeless tobacco: Never Used  Substance Use Topics  . Alcohol use: Yes    Comment: occassional  . Drug use: No     Allergies   Gabapentin, Lisinopril, and Nortriptyline   Review of Systems Review of Systems  Constitutional: Negative for fever.  Musculoskeletal: Positive for joint swelling. Negative for arthralgias and myalgias.  Skin: Positive for color change and wound.  Neurological: Negative for weakness and numbness.      Physical Exam Updated Vital Signs BP 124/89 (BP Location: Right Arm)   Pulse 93   Temp 98.6 F (37 C) (Oral)   Resp 17   Ht 5\' 11"  (1.803 m)   Wt 108.9 kg   SpO2 96%   BMI 33.47 kg/m   Physical Exam Constitutional:      Appearance: He is well-developed.  HENT:     Head: Atraumatic.  Neck:     Musculoskeletal: Normal range of motion.  Musculoskeletal:        General: Swelling present. No tenderness.     Comments: Left great toe is significantly erythematous and swollen.  There is no tenderness.  There  is a deep ulceration on the plantar surface of the distal phalanx.  There is tenderness and erythema with nonpitting edema of his left medial and anterior lower leg.  Dorsalis pedis pulses intact.  The dorsal foot is relatively normal in appearance.  Skin:    General: Skin is warm and dry.  Neurological:     Mental Status: He is alert.     Sensory: No sensory deficit.     Deep Tendon Reflexes: Reflexes normal.      ED Treatments / Results  Labs (all labs ordered are listed, but only abnormal results are displayed) Labs Reviewed  CBC WITH DIFFERENTIAL/PLATELET - Abnormal; Notable for the following components:      Result Value   WBC 11.6 (*)    RBC 5.96 (*)    Hemoglobin 17.2 (*)    Neutro Abs 7.8 (*)    Monocytes Absolute 1.2 (*)    Abs Immature Granulocytes 0.08 (*)    All other components within normal limits  BASIC METABOLIC PANEL - Abnormal; Notable for the following components:   Glucose, Bld 127 (*)    BUN 25 (*)    All other components within normal limits  C-REACTIVE PROTEIN - Abnormal; Notable for the following components:   CRP 8.6 (*)    All other components within normal limits  SARS CORONAVIRUS 2 (TAT 6-24 HRS)  SEDIMENTATION RATE    EKG None  Radiology Dg Foot Complete Left  Result Date: 07/28/2019 CLINICAL DATA:  Left great toe infection for a month. EXAM: LEFT FOOT - COMPLETE 3+ VIEW COMPARISON:  None. FINDINGS: Great toe is swollen. Air is  seen in the distal soft tissues. Possible erosion of the distal tuft. The second toes been amputated. The remainder of the toes are normal. Contour abnormality at the base of the third metatarsal. Metatarsals are otherwise normal. Degenerative changes in the midfoot. IMPRESSION: 1. Swollen soft tissues in the great toe. Air in the soft tissues consistent with history of infection. The soft tissue air could be from ulceration or a gas forming organism in the soft tissues. 2. Possible mild erosion in the lateral aspect of the distal first tuft suspicious for subtle osteomyelitis. This is not seen on other views. An MRI could better assess to confirm osteomyelitis. 3. Contour abnormality at the base of the third metatarsal. This region was not as well seen previously. This is not in close proximity to the known infection and the finding could be artifactual. Recommend attention to this region on the MRI if performed. Otherwise, recommend clinical correlation. Electronically Signed   By: Dorise Bullion III M.D   On: 07/28/2019 21:13    Procedures Procedures (including critical care time)  Medications Ordered in ED Medications - No data to display   Initial Impression / Assessment and Plan / ED Course  I have reviewed the triage vital signs and the nursing notes.  Pertinent labs & imaging results that were available during my care of the patient were reviewed by me and considered in my medical decision making (see chart for details).        Labs and imaging reviewed.  Pt with persistent worsening great toe infection with lower leg cellulitis despite multiple courses of abx.  H/o strep infection as source of prior osteomyelitis prior to amputation of left 2nd toe.  Plain films suggesting possible osteomyelitis. Admission for IV abx, MRI per Dr. Posey Pronto request.    Call to Dr Darrick Meigs who will see and admit pt.  Final Clinical Impressions(s) / ED Diagnoses   Final diagnoses:  Wound infection   Cellulitis of left lower extremity    ED Discharge Orders    None       Landis Martins 07/28/19 2322    Fredia Sorrow, MD 08/06/19 [phone]

## 2019-07-28 NOTE — ED Triage Notes (Signed)
Pt sent from Dr Posey Pronto [place] evaluation of left great toe with infection, pt presents with Rx stating pt needs admission, IV antibiotics, MRI, and r/o osteomyelitis.

## 2019-07-29 ENCOUNTER — Encounter (HOSPITAL_COMMUNITY): Payer: Self-pay

## 2019-07-29 ENCOUNTER — Inpatient Hospital Stay (HOSPITAL_COMMUNITY): Payer: BC Managed Care – PPO

## 2019-07-29 DIAGNOSIS — T148XXA Other injury of unspecified body region, initial encounter: Secondary | ICD-10-CM | POA: Diagnosis present

## 2019-07-29 DIAGNOSIS — Z8249 Family history of ischemic heart disease and other diseases of the circulatory system: Secondary | ICD-10-CM | POA: Diagnosis not present

## 2019-07-29 DIAGNOSIS — F411 Generalized anxiety disorder: Secondary | ICD-10-CM | POA: Diagnosis present

## 2019-07-29 DIAGNOSIS — G629 Polyneuropathy, unspecified: Secondary | ICD-10-CM | POA: Diagnosis present

## 2019-07-29 DIAGNOSIS — T368X5A Adverse effect of other systemic antibiotics, initial encounter: Secondary | ICD-10-CM | POA: Diagnosis not present

## 2019-07-29 DIAGNOSIS — Z833 Family history of diabetes mellitus: Secondary | ICD-10-CM | POA: Diagnosis not present

## 2019-07-29 DIAGNOSIS — Z87442 Personal history of urinary calculi: Secondary | ICD-10-CM | POA: Diagnosis not present

## 2019-07-29 DIAGNOSIS — Y92239 Unspecified place in hospital as the place of occurrence of the external cause: Secondary | ICD-10-CM | POA: Diagnosis not present

## 2019-07-29 DIAGNOSIS — R197 Diarrhea, unspecified: Secondary | ICD-10-CM | POA: Diagnosis not present

## 2019-07-29 DIAGNOSIS — Z800 Family history of malignant neoplasm of digestive organs: Secondary | ICD-10-CM | POA: Diagnosis not present

## 2019-07-29 DIAGNOSIS — F329 Major depressive disorder, single episode, unspecified: Secondary | ICD-10-CM | POA: Diagnosis present

## 2019-07-29 DIAGNOSIS — Z89422 Acquired absence of other left toe(s): Secondary | ICD-10-CM | POA: Diagnosis not present

## 2019-07-29 DIAGNOSIS — K219 Gastro-esophageal reflux disease without esophagitis: Secondary | ICD-10-CM | POA: Diagnosis present

## 2019-07-29 DIAGNOSIS — L03116 Cellulitis of left lower limb: Secondary | ICD-10-CM | POA: Diagnosis present

## 2019-07-29 DIAGNOSIS — Z791 Long term (current) use of non-steroidal anti-inflammatories (NSAID): Secondary | ICD-10-CM | POA: Diagnosis not present

## 2019-07-29 DIAGNOSIS — Z20828 Contact with and (suspected) exposure to other viral communicable diseases: Secondary | ICD-10-CM | POA: Diagnosis present

## 2019-07-29 DIAGNOSIS — I872 Venous insufficiency (chronic) (peripheral): Secondary | ICD-10-CM | POA: Diagnosis present

## 2019-07-29 DIAGNOSIS — M86172 Other acute osteomyelitis, left ankle and foot: Secondary | ICD-10-CM | POA: Diagnosis present

## 2019-07-29 DIAGNOSIS — L089 Local infection of the skin and subcutaneous tissue, unspecified: Secondary | ICD-10-CM | POA: Diagnosis not present

## 2019-07-29 DIAGNOSIS — I2510 Atherosclerotic heart disease of native coronary artery without angina pectoris: Secondary | ICD-10-CM | POA: Diagnosis present

## 2019-07-29 DIAGNOSIS — I10 Essential (primary) hypertension: Secondary | ICD-10-CM | POA: Diagnosis present

## 2019-07-29 DIAGNOSIS — G4730 Sleep apnea, unspecified: Secondary | ICD-10-CM | POA: Diagnosis present

## 2019-07-29 DIAGNOSIS — M109 Gout, unspecified: Secondary | ICD-10-CM | POA: Diagnosis present

## 2019-07-29 DIAGNOSIS — E785 Hyperlipidemia, unspecified: Secondary | ICD-10-CM | POA: Diagnosis present

## 2019-07-29 DIAGNOSIS — Z79890 Hormone replacement therapy (postmenopausal): Secondary | ICD-10-CM | POA: Diagnosis not present

## 2019-07-29 DIAGNOSIS — Z888 Allergy status to other drugs, medicaments and biological substances status: Secondary | ICD-10-CM | POA: Diagnosis not present

## 2019-07-29 DIAGNOSIS — Z87891 Personal history of nicotine dependence: Secondary | ICD-10-CM | POA: Diagnosis not present

## 2019-07-29 LAB — COMPREHENSIVE METABOLIC PANEL
ALT: 28 U/L (ref 0–44)
AST: 16 U/L (ref 15–41)
Albumin: 3.5 g/dL (ref 3.5–5.0)
Alkaline Phosphatase: 78 U/L (ref 38–126)
Anion gap: 9 (ref 5–15)
BUN: 24 mg/dL — ABNORMAL HIGH (ref 6–20)
CO2: 21 mmol/L — ABNORMAL LOW (ref 22–32)
Calcium: 8.3 mg/dL — ABNORMAL LOW (ref 8.9–10.3)
Chloride: 105 mmol/L (ref 98–111)
Creatinine, Ser: 1.32 mg/dL — ABNORMAL HIGH (ref 0.61–1.24)
GFR calc Af Amer: >60 mL/min (ref >60)
GFR calc non Af Amer: 59 mL/min — ABNORMAL LOW (ref >60)
Glucose, Bld: 181 mg/dL — ABNORMAL HIGH (ref 70–99)
Potassium: 3.8 mmol/L (ref 3.5–5.1)
Sodium: 135 mmol/L (ref 135–145)
Total Bilirubin: 0.9 mg/dL (ref 0.3–1.2)
Total Protein: 7.1 g/dL (ref 6.5–8.1)

## 2019-07-29 LAB — CBC
HCT: 48.2 % (ref 39.0–52.0)
Hemoglobin: 16.1 g/dL (ref 13.0–17.0)
MCH: 29.1 pg (ref 26.0–34.0)
MCHC: 33.4 g/dL (ref 30.0–36.0)
MCV: 87 fL (ref 80.0–100.0)
Platelets: 226 10*3/uL (ref 150–400)
RBC: 5.54 MIL/uL (ref 4.22–5.81)
RDW: 14.6 % (ref 11.5–15.5)
WBC: 8.5 10*3/uL (ref 4.0–10.5)
nRBC: 0 % (ref 0.0–0.2)

## 2019-07-29 LAB — HIV ANTIBODY (ROUTINE TESTING W REFLEX): HIV Screen 4th Generation wRfx: NONREACTIVE

## 2019-07-29 LAB — SARS CORONAVIRUS 2 (TAT 6-24 HRS): SARS Coronavirus 2: NEGATIVE

## 2019-07-29 MED ORDER — IRBESARTAN 150 MG PO TABS
300.0000 mg | ORAL_TABLET | Freq: Every day | ORAL | Status: DC
  Administered 2019-07-30 – 2019-07-31 (×2): 300 mg via ORAL
  Filled 2019-07-29 (×2): qty 2

## 2019-07-29 MED ORDER — VANCOMYCIN HCL 10 G IV SOLR
1750.0000 mg | INTRAVENOUS | Status: DC
  Administered 2019-07-30: 05:00:00 1750 mg via INTRAVENOUS
  Filled 2019-07-29 (×3): qty 1750

## 2019-07-29 MED ORDER — BUPROPION HCL ER (SR) 150 MG PO TB12
150.0000 mg | ORAL_TABLET | Freq: Two times a day (BID) | ORAL | Status: DC
  Administered 2019-07-29 – 2019-07-31 (×6): 150 mg via ORAL
  Filled 2019-07-29 (×6): qty 1

## 2019-07-29 MED ORDER — PIPERACILLIN-TAZOBACTAM 3.375 G IVPB
3.3750 g | Freq: Three times a day (TID) | INTRAVENOUS | Status: DC
  Administered 2019-07-29: 10:00:00 3.375 g via INTRAVENOUS
  Filled 2019-07-29: qty 50

## 2019-07-29 MED ORDER — CARVEDILOL 12.5 MG PO TABS
25.0000 mg | ORAL_TABLET | Freq: Two times a day (BID) | ORAL | Status: DC
  Administered 2019-07-29 – 2019-07-31 (×4): 25 mg via ORAL
  Filled 2019-07-29 (×5): qty 2

## 2019-07-29 MED ORDER — SPIRONOLACTONE 25 MG PO TABS
25.0000 mg | ORAL_TABLET | Freq: Every day | ORAL | Status: DC
  Administered 2019-07-30 – 2019-07-31 (×2): 25 mg via ORAL
  Filled 2019-07-29 (×2): qty 1

## 2019-07-29 MED ORDER — PIPERACILLIN-TAZOBACTAM 3.375 G IVPB
3.3750 g | Freq: Three times a day (TID) | INTRAVENOUS | Status: DC
  Administered 2019-07-29 – 2019-07-31 (×5): 3.375 g via INTRAVENOUS
  Filled 2019-07-29 (×5): qty 50

## 2019-07-29 MED ORDER — VANCOMYCIN HCL IN DEXTROSE 1-5 GM/200ML-% IV SOLN
1000.0000 mg | INTRAVENOUS | Status: AC
  Administered 2019-07-29 (×2): 1000 mg via INTRAVENOUS
  Filled 2019-07-29 (×2): qty 200

## 2019-07-29 MED ORDER — MELATONIN 5 MG PO TABS
5.0000 mg | ORAL_TABLET | Freq: Every evening | ORAL | Status: DC | PRN
  Filled 2019-07-29: qty 1

## 2019-07-29 MED ORDER — ONDANSETRON HCL 4 MG/2ML IJ SOLN: 4.0000 mg | Freq: Four times a day (QID) | INTRAMUSCULAR | Status: DC | PRN

## 2019-07-29 MED ORDER — MELATONIN 3 MG PO TABS
3.0000 mg | ORAL_TABLET | Freq: Every evening | ORAL | Status: DC | PRN
  Administered 2019-07-29 – 2019-07-30 (×2): 3 mg via ORAL
  Filled 2019-07-29 (×2): qty 1

## 2019-07-29 MED ORDER — ONDANSETRON HCL 4 MG PO TABS: 4.0000 mg | ORAL_TABLET | Freq: Four times a day (QID) | ORAL | Status: DC | PRN

## 2019-07-29 MED ORDER — SODIUM CHLORIDE 0.9 % IV SOLN
INTRAVENOUS | Status: AC
  Administered 2019-07-29: 10:00:00 via INTRAVENOUS

## 2019-07-29 MED ORDER — HYDROCHLOROTHIAZIDE 25 MG PO TABS: 25.0000 mg | ORAL_TABLET | Freq: Every day | ORAL | Status: DC

## 2019-07-29 MED ORDER — PANTOPRAZOLE SODIUM 40 MG PO TBEC: 40.0000 mg | DELAYED_RELEASE_TABLET | Freq: Every day | ORAL | Status: DC

## 2019-07-29 MED ORDER — SPIRONOLACTONE 25 MG PO TABS: 25.0000 mg | ORAL_TABLET | Freq: Every day | ORAL | Status: DC

## 2019-07-29 MED ORDER — SODIUM CHLORIDE 0.9 % IV SOLN: INTRAVENOUS | Status: DC

## 2019-07-29 MED ORDER — ENOXAPARIN SODIUM 60 MG/0.6ML ~~LOC~~ SOLN
55.0000 mg | SUBCUTANEOUS | Status: DC
  Administered 2019-07-29 – 2019-07-31 (×3): 55 mg via SUBCUTANEOUS
  Filled 2019-07-29 (×3): qty 0.6

## 2019-07-29 MED ORDER — PANTOPRAZOLE SODIUM 40 MG PO TBEC
40.0000 mg | DELAYED_RELEASE_TABLET | Freq: Every day | ORAL | Status: DC
  Administered 2019-07-31: 09:00:00 40 mg via ORAL
  Filled 2019-07-29 (×2): qty 1

## 2019-07-29 MED ORDER — PIPERACILLIN-TAZOBACTAM 3.375 G IVPB 30 MIN
3.3750 g | Freq: Once | INTRAVENOUS | Status: AC
  Administered 2019-07-29: 03:00:00 3.375 g via INTRAVENOUS
  Filled 2019-07-29: qty 50

## 2019-07-29 MED ORDER — IRBESARTAN 150 MG PO TABS: 300.0000 mg | ORAL_TABLET | Freq: Every day | ORAL | Status: DC

## 2019-07-29 MED ORDER — ACETAMINOPHEN 500 MG PO TABS: 500.0000 mg | ORAL_TABLET | Freq: Four times a day (QID) | ORAL | Status: DC | PRN

## 2019-07-29 NOTE — Progress Notes (Signed)
Patient anxious about not being able to take his home medications. Pharmacy made aware and home med list updated by pharmacy tech.  Patient is anxious that without it his blood pressure will increase and we will not be able to get it under control. MD made aware about patient's request to take home medications.

## 2019-07-29 NOTE — Progress Notes (Signed)
Dr. Serita Grit office called (at [phone]) and made aware of consultation needed while patient is in hospital per order.

## 2019-07-29 NOTE — Consult Note (Signed)
Podiatry consult note:   Patient is 58 year old male seen today at bedside. Patient is well known to me and has been following up with for chronic ulceration of the left great toe. I sent the patient to hospital on 07/28/2019 to rule out osteomyelitis of the left great toe and cellulitis. Patient states the swelling in the left leg is still there and the redness is still there. He feels better. Denies any nausea, vomiting, fever or chills.   Vitals:   07/29/19 0218 07/29/19 0600  BP: 111/71 120/75  Pulse: 84 80  Resp: 18 16  Temp: 99.1 F (37.3 C) 99.3 F (37.4 C)  SpO2: 97% 98%   Past Medical History:  Diagnosis Date  . Adrenal gland neoplasm   . Anxiety   . Coronary atherosclerosis   . Depression   . GERD (gastroesophageal reflux disease)   . Gout   . History of kidney stones   . Hyperlipidemia   . Hypertension   . Neuropathy   . Peripheral neuropathy 05/17/2019  . Peripheral venous insufficiency   . Sleep apnea         Patient Active Problem List   Diagnosis Date Noted  . Peripheral neuropathy 05/17/2019         Past Surgical History:  Procedure Laterality Date  . ADRENALECTOMY Left 2015   tumors removed  . AMPUTATION Left 06/16/2018   Procedure: PARTIAL TOE AMPUTATION - LEFT SECOND TOE;  Surgeon: Tyson Babinski, DPM;  Location: [facility];  Service: Podiatry;  Laterality: Left;  . DISTAL INTERPHALANGEAL JOINT FUSION Right 07/23/2016   Procedure: RIGHT 1ST INTERPHALANGEAL JOINT FUSION;  Surgeon: Tyson Babinski, DPM;  Location: [facility];  Service: Podiatry;  Laterality: Right;  . REPAIR EXTENSOR TENDON Right 07/23/2016   Procedure: EXTENSOR TENDON LENGTHENING;  Surgeon: Tyson Babinski, DPM;  Location: [facility];  Service: Podiatry;  Laterality: Right;  . SALIVARY GLAND SURGERY Left 12/2015  . TONSILLECTOMY    . TONSILLECTOMY AND ADENOIDECTOMY                 Family History      Family History  Problem Relation Age of Onset  . Colon  cancer Mother   . Diabetes Father        diabetic coma  . Heart attack Brother     Social History Social History        Tobacco Use  . Smoking status: Former Smoker    Packs/day: 0.50    Years: 40.00    Pack years: 20.00    Types: Cigarettes, Cigars    Quit date: 05/21/2016    Years since quitting: 3.1  . Smokeless tobacco: Never Used  Substance Use Topics  . Alcohol use: Yes    Comment: occassional  . Drug use: No     Allergies              Gabapentin, Lisinopril, and Nortriptyline   Review of Systems Review of Systems  Constitutional: Negative for fever.  Musculoskeletal: Positive for joint swelling. Negative for arthralgias and myalgias.  Skin: Positive for color change and wound.   Physical exam:   Vascular: DP palpable. PT faintly palpable. Edema noted of the left lower leg. Capilllary refill time <3 seconds. Hair growth noted to lesser digits. Foot is warm to touch. No calf pain or chest pain.  Dermatology: There is full thickness ulcer noted at the plantar aspect of the IPJ of the left hallux. The ulcer is measured to be 1.3x0.3x0.4.  Ulcer does probe to bone. There is serous drainage noted. There is increase in warmth and redness to the level of the MPJ. The left hallux nail is dystrophic. The skin is discolored, and macerated with hemorrhage appearance at the distal tip of the left hallux.  Musculoskeletol: Partial 2nd toe amputation noted on the left. The left hallux is contracted at the IPJ.  Neurology: Protective sensation is absent. Patient has no feeling due to neuropathy.         Results for orders placed or performed during the hospital encounter of 07/28/19  CBC with Differential  Result Value Ref Range   WBC 11.6 (H) 4.0 - 10.5 K/uL   RBC 5.96 (H) 4.22 - 5.81 MIL/uL   Hemoglobin 17.2 (H) 13.0 - 17.0 g/dL   HCT 52.0 39.0 - 52.0 %   MCV 87.2 80.0 - 100.0 fL   MCH 28.9 26.0 - 34.0 pg   MCHC 33.1 30.0 - 36.0 g/dL    RDW 15.2 11.5 - 15.5 %   Platelets 256 150 - 400 K/uL   nRBC 0.0 0.0 - 0.2 %   Neutrophils Relative % 66 %   Neutro Abs 7.8 (H) 1.7 - 7.7 K/uL   Lymphocytes Relative 19 %   Lymphs Abs 2.2 0.7 - 4.0 K/uL   Monocytes Relative 11 %   Monocytes Absolute 1.2 (H) 0.1 - 1.0 K/uL   Eosinophils Relative 2 %   Eosinophils Absolute 0.3 0.0 - 0.5 K/uL   Basophils Relative 1 %   Basophils Absolute 0.1 0.0 - 0.1 K/uL   Immature Granulocytes 1 %   Abs Immature Granulocytes 0.08 (H) 0.00 - 0.07 K/uL  Basic metabolic panel  Result Value Ref Range   Sodium 136 135 - 145 mmol/L   Potassium 4.1 3.5 - 5.1 mmol/L   Chloride 100 98 - 111 mmol/L   CO2 24 22 - 32 mmol/L   Glucose, Bld 127 (H) 70 - 99 mg/dL   BUN 25 (H) 6 - 20 mg/dL   Creatinine, Ser 1.24 0.61 - 1.24 mg/dL   Calcium 9.3 8.9 - 10.3 mg/dL   GFR calc non Af Amer >60 >60 mL/min   GFR calc Af Amer >60 >60 mL/min   Anion gap 12 5 - 15  Sedimentation rate  Result Value Ref Range   Sed Rate 4 0 - 16 mm/hr  C-reactive protein  Result Value Ref Range   CRP 8.6 (H) <1.0 mg/dL  CBC  Result Value Ref Range   WBC 8.5 4.0 - 10.5 K/uL   RBC 5.54 4.22 - 5.81 MIL/uL   Hemoglobin 16.1 13.0 - 17.0 g/dL   HCT 48.2 39.0 - 52.0 %   MCV 87.0 80.0 - 100.0 fL   MCH 29.1 26.0 - 34.0 pg   MCHC 33.4 30.0 - 36.0 g/dL   RDW 14.6 11.5 - 15.5 %   Platelets 226 150 - 400 K/uL   nRBC 0.0 0.0 - 0.2 %  Comprehensive metabolic panel  Result Value Ref Range   Sodium 135 135 - 145 mmol/L   Potassium 3.8 3.5 - 5.1 mmol/L   Chloride 105 98 - 111 mmol/L   CO2 21 (L) 22 - 32 mmol/L   Glucose, Bld 181 (H) 70 - 99 mg/dL   BUN 24 (H) 6 - 20 mg/dL   Creatinine, Ser 1.32 (H) 0.61 - 1.24 mg/dL   Calcium 8.3 (L) 8.9 - 10.3 mg/dL   Total Protein 7.1 6.5 - 8.1 g/dL   Albumin  3.5 3.5 - 5.0 g/dL   AST 16 15 - 41 U/L   ALT 28 0 - 44 U/L   Alkaline Phosphatase 78 38 - 126 U/L   Total Bilirubin 0.9 0.3 - 1.2 mg/dL   GFR  calc non Af Amer 59 (L) >60 mL/min   GFR calc Af Amer >60 >60 mL/min   Anion gap 9 5 - 15   Study Result  CLINICAL DATA:  Left great toe infection for a month.  EXAM: LEFT FOOT - COMPLETE 3+ VIEW  COMPARISON:  None.  FINDINGS: Great toe is swollen. Air is seen in the distal soft tissues. Possible erosion of the distal tuft. The second toes been amputated. The remainder of the toes are normal. Contour abnormality at the base of the third metatarsal. Metatarsals are otherwise normal. Degenerative changes in the midfoot.  IMPRESSION: 1. Swollen soft tissues in the great toe. Air in the soft tissues consistent with history of infection. The soft tissue air could be from ulceration or a gas forming organism in the soft tissues. 2. Possible mild erosion in the lateral aspect of the distal first tuft suspicious for subtle osteomyelitis. This is not seen on other views. An MRI could better assess to confirm osteomyelitis. 3. Contour abnormality at the base of the third metatarsal. This region was not as well seen previously. This is not in close proximity to the known infection and the finding could be artifactual. Recommend attention to this region on the MRI if performed. Otherwise, recommend clinical correlation.   CLINICAL DATA:  Left great toe infection, swelling, diabetes  EXAM: MRI OF THE LEFT FOOT WITHOUT CONTRAST  TECHNIQUE: Multiplanar, multisequence MR imaging of the left forefoot was performed. No intravenous contrast was administered.  COMPARISON:  X-ray 07/28/2019, 06/15/2018  FINDINGS: Bones/Joint/Cartilage  Cortical erosion of the distal tuft of the great toe with bone marrow edema throughout the great toe distal phalanx. There is confluent low T1 signal changes throughout the distal tuft and along the lateral margin of the distal phalanx closely approximating the IP joint (series 6, image 18). There is mild edema like marrow signal within the  great toe proximal phalanx adjacent to the IP joint without T1 signal changes to suggest osteomyelitis.  Patient is status post second digit amputation at the level of the neck of the second digit proximal phalanx. Subtle T2 hyperintense signal at the resection margin (series 7, image 10) without associated T1 signal abnormality.  Marrow edema within the proximal metaphyses of the second, third, and fourth metatarsals without definite linear low signal intensity fracture line (series 5, images 7-9). There are small marginal erosions centered at the TMT joints (for example series 6, images 8-10).  Patchy marrow edema throughout the bones of the midfoot including the navicular and cuneiforms. Dorsal proliferative changes throughout the midfoot.  Ligaments  Intrinsic foot ligaments including the Lisfranc ligament are intact.  Muscles and Tendons  Diffuse muscle edema and fatty infiltration of the intrinsic foot musculature. Flexor and extensor tendons are grossly intact. No tenosynovial fluid collection.  Soft tissues  There is soft tissue swelling with skin thickening and subcutaneous edema at the level of the great toe distal phalanx. Mild subcutaneous edema over the dorsum of the foot. No well-defined or drainable fluid collections.  IMPRESSION: 1. Cortical erosion and marrow signal changes within the great toe distal phalanx compatible with acute osteomyelitis. 2. Mild marrow edema within the distal aspect of the great toe proximal phalanx without associated T1 signal change.  Findings favored to represent reactive osteitis without evidence of osteomyelitis. No joint effusion at the great toe IP joint to suggest septic arthritis. 3. Prominent marrow edema within the proximal metaphyses of the second, third, and fourth metatarsals without discrete fracture line. Findings are favored to represent stress related changes. 4. Patchy milder marrow edema throughout  the bones of the midfoot with scattered small marginal erosions within the midfoot and at the second-fourth metatarsal bases. Findings are nonspecific and may represent developing Charcot arthropathy versus an inflammatory arthropathy. Infection in this area is not excluded although felt to be less likely given the lack of changes within the adjacent soft tissues. 5. Diffuse muscle edema and fatty infiltration of the intrinsic foot musculature most consistent with diabetic neuropathy.   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  Peripheral neuropathy.   Plan: Patient examined and evaluated at bedside.  I cleansed the wound and applied collagen dressing on it.  I explained him all the risk and benefits of the bone infection and offered different treatment option.  I discussed partial hallux amputation as outpatient if he decides to choose it.  I recommend 6-8 weeks if IV antibiotics per ID recommendation. I did wound cultures at the office and the results are pending. We can adjust the antibiotics as outpatient if need to. I also discussed hyperbaric treatment and wound care in [place]. I will call the wound care center on Monday 08/01/2019 to see if he qualifies for the hyperbaric treatment.  As of now patient wants to try to save the toe but he is not against the amputation either. If he does not qualify for the hyperbaric, we can do the amputation as outpatient.  Patient can be discharged with PICC line and can follow up with me in the office. I will follow up with the patient while he is in the hospital.

## 2019-07-29 NOTE — Progress Notes (Signed)
ASSUMPTION OF CARE NOTE   07/29/2019 10:49 AM  Jacob Munoz was seen and examined.  The H&P by the admitting provider, orders, imaging was reviewed.  Please see new orders.  Will continue to follow.   MRI reviewed with findings of acute osteomyelitis. I placed consult to podiatrist Dr. Posey Pronto.  Continue IV antibiotics for now.    Vitals:   07/29/19 0218 07/29/19 0600  BP: 111/71 120/75  Pulse: 84 80  Resp: 18 16  Temp: 99.1 F (37.3 C) 99.3 F (37.4 C)  SpO2: 97% 98%    Results for orders placed or performed during the hospital encounter of 07/28/19  CBC with Differential  Result Value Ref Range   WBC 11.6 (H) 4.0 - 10.5 K/uL   RBC 5.96 (H) 4.22 - 5.81 MIL/uL   Hemoglobin 17.2 (H) 13.0 - 17.0 g/dL   HCT 52.0 39.0 - 52.0 %   MCV 87.2 80.0 - 100.0 fL   MCH 28.9 26.0 - 34.0 pg   MCHC 33.1 30.0 - 36.0 g/dL   RDW 15.2 11.5 - 15.5 %   Platelets 256 150 - 400 K/uL   nRBC 0.0 0.0 - 0.2 %   Neutrophils Relative % 66 %   Neutro Abs 7.8 (H) 1.7 - 7.7 K/uL   Lymphocytes Relative 19 %   Lymphs Abs 2.2 0.7 - 4.0 K/uL   Monocytes Relative 11 %   Monocytes Absolute 1.2 (H) 0.1 - 1.0 K/uL   Eosinophils Relative 2 %   Eosinophils Absolute 0.3 0.0 - 0.5 K/uL   Basophils Relative 1 %   Basophils Absolute 0.1 0.0 - 0.1 K/uL   Immature Granulocytes 1 %   Abs Immature Granulocytes 0.08 (H) 0.00 - 0.07 K/uL  Basic metabolic panel  Result Value Ref Range   Sodium 136 135 - 145 mmol/L   Potassium 4.1 3.5 - 5.1 mmol/L   Chloride 100 98 - 111 mmol/L   CO2 24 22 - 32 mmol/L   Glucose, Bld 127 (H) 70 - 99 mg/dL   BUN 25 (H) 6 - 20 mg/dL   Creatinine, Ser 1.24 0.61 - 1.24 mg/dL   Calcium 9.3 8.9 - 10.3 mg/dL   GFR calc non Af Amer >60 >60 mL/min   GFR calc Af Amer >60 >60 mL/min   Anion gap 12 5 - 15  Sedimentation rate  Result Value Ref Range   Sed Rate 4 0 - 16 mm/hr  C-reactive protein  Result Value Ref Range   CRP 8.6 (H) <1.0 mg/dL  CBC  Result Value Ref Range   WBC 8.5 4.0 -  10.5 K/uL   RBC 5.54 4.22 - 5.81 MIL/uL   Hemoglobin 16.1 13.0 - 17.0 g/dL   HCT 48.2 39.0 - 52.0 %   MCV 87.0 80.0 - 100.0 fL   MCH 29.1 26.0 - 34.0 pg   MCHC 33.4 30.0 - 36.0 g/dL   RDW 14.6 11.5 - 15.5 %   Platelets 226 150 - 400 K/uL   nRBC 0.0 0.0 - 0.2 %  Comprehensive metabolic panel  Result Value Ref Range   Sodium 135 135 - 145 mmol/L   Potassium 3.8 3.5 - 5.1 mmol/L   Chloride 105 98 - 111 mmol/L   CO2 21 (L) 22 - 32 mmol/L   Glucose, Bld 181 (H) 70 - 99 mg/dL   BUN 24 (H) 6 - 20 mg/dL   Creatinine, Ser 1.32 (H) 0.61 - 1.24 mg/dL   Calcium 8.3 (L) 8.9 -  10.3 mg/dL   Total Protein 7.1 6.5 - 8.1 g/dL   Albumin 3.5 3.5 - 5.0 g/dL   AST 16 15 - 41 U/L   ALT 28 0 - 44 U/L   Alkaline Phosphatase 78 38 - 126 U/L   Total Bilirubin 0.9 0.3 - 1.2 mg/dL   GFR calc non Af Amer 59 (L) >60 mL/min   GFR calc Af Amer >60 >60 mL/min   Anion gap 9 5 - 15    Total Time spent: 32 minutes   Murvin Natal, MD [facility]   07/28/2019  7:34 PM How to contact the [facility] Attending or Consulting provider [place] or covering provider during after hours [place], for this patient?  1. Check the care team in [facility] and look for a) attending/consulting [facility] provider listed and b) the [facility] team listed 2. Log into [email or web address] and use [place]'s universal password to access. If you do not have the password, please contact the hospital operator. 3. Locate the [facility] provider you are looking for under [facility] and page to a number that you can be directly reached. 4. If you still have difficulty reaching the provider, please page the [facility] (Director on Call) for the Hospitalists listed on amion for assistance.

## 2019-07-29 NOTE — H&P (Addendum)
TRH H&P    Patient Demographics:    Jacob Munoz, is a 58 y.o. male  MRN: [id number]  DOB - 08-02-1961  Admit Date - 07/28/2019  Referring MD/NP/PA: Evalee Jefferson  Outpatient Primary MD for the patient is Allayne Butcher, NP  Patient coming from: [place] office  Chief complaint-left great toe infection   HPI:    Jacob Munoz  is a 58 y.o. 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 Patient says that he developed a callus on his inferior left great toe approximately 1 month ago which became infected, since that time he has had chronic infection and swelling of the toe after multiple rounds of antibiotics.  Patient says initially was placed on Cipro and clindamycin, when infection did not respond.  He was switched to doxycycline with some improvement but after the antibiotics was finished the swelling and redness returned.  He was seen by his podiatrist Dr. Posey Pronto in his office and was sent for MRI of the foot with IV antibiotics for failed outpatient treatment. Patient denies any pain, he has peripheral neuropathy.  Patient also developed redness along the medial aspect of left lower extremity which is tender to palpation. He denies any fever but felt hot with sweating yesterday. He also complains of diarrhea since he started taking clindamycin.  He had 2 loose BM today. In the ED x-ray of the foot showed swollen soft tissue in the great toe.  Air in the soft tissue consistent with history of infection.  Possible mild erosion in the lateral aspect of distal first tuft suspicious for subtle osteomyelitis.  He denies abdominal pain but complains of feeling of a protruding mass in  the left upper quadrant which appeared few days ago.  Denies any swelling or pain at this time. Denies chest pain or shortness of breath. Denies cough    Review of systems:    In addition to the HPI above,   All other systems reviewed and are negative.    Past History of the following :    Past Medical History:  Diagnosis Date  . Adrenal gland neoplasm   . Anxiety   . Coronary atherosclerosis   . Depression   . GERD (gastroesophageal reflux disease)   . Gout   . History of kidney stones   . Hyperlipidemia   . Hypertension   . Neuropathy   . Peripheral neuropathy 05/17/2019  . Peripheral venous insufficiency   . Sleep apnea       Past Surgical History:  Procedure Laterality Date  . ADRENALECTOMY Left 2015   tumors removed  . AMPUTATION Left 06/16/2018   Procedure: PARTIAL TOE AMPUTATION - LEFT SECOND TOE;  Surgeon: Tyson Babinski, DPM;  Location: [facility];  Service: Podiatry;  Laterality: Left;  . DISTAL INTERPHALANGEAL JOINT FUSION Right 07/23/2016   Procedure: RIGHT 1ST INTERPHALANGEAL JOINT FUSION;  Surgeon: Tyson Babinski, DPM;  Location: [facility];  Service: Podiatry;  Laterality: Right;  . REPAIR EXTENSOR TENDON Right 07/23/2016   Procedure: EXTENSOR TENDON LENGTHENING;  Surgeon: Tyson Babinski, DPM;  Location: [facility];  Service: Podiatry;  Laterality: Right;  . SALIVARY GLAND SURGERY Left 12/2015  . TONSILLECTOMY    . TONSILLECTOMY AND ADENOIDECTOMY        Social History:      Social History   Tobacco Use  . Smoking status: Former Smoker    Packs/day: 0.50    Years: 40.00    Pack years: 20.00    Types: Cigarettes, Cigars    Quit date: 05/21/2016    Years since quitting: 3.1  . Smokeless tobacco: Never Used  Substance Use Topics  . Alcohol use: Yes    Comment: occassional       Family History :     Family History  Problem Relation Age of Onset  . Colon cancer Mother   . Diabetes Father        diabetic coma  . Heart attack Brother        Home Medications:   Prior to Admission medications   Medication Sig Start Date End Date Taking? Authorizing Provider  acetaminophen (TYLENOL) 500 MG tablet Take 500-1,000 mg by mouth every 6 (six) hours as needed (for pain.).    [provider]  buPROPion (WELLBUTRIN SR) 150 MG 12 hr tablet Take 150 mg by mouth 2 (two) times daily.    [provider]  carvedilol (COREG) 25 MG tablet Take 25 mg by mouth 2 (two) times daily. 07/21/19   [provider]  Chromium Picolinate 1000 MCG TABS Take by mouth.    [provider]  ciprofloxacin (CIPRO) 500 MG tablet Take 500 mg by mouth 2 (two) times daily. 07/25/19   [provider]  clindamycin (CLEOCIN) 300 MG capsule Take 300 mg by mouth 3 (three) times daily. 07/25/19   [provider]  doxycycline (VIBRAMYCIN) 100 MG capsule Take 100 mg by mouth 2 (two) times daily. 07/14/19   [provider]  hydrochlorothiazide (HYDRODIURIL) 25 MG tablet Take 25 mg by mouth daily. 07/21/19   [provider]  ibuprofen (ADVIL,MOTRIN) 200 MG tablet Take 200-400 mg by mouth 2 (two) times daily as needed (for pain/numbness).     [provider]  L-ARGININE-500 PO Take 1 tablet by mouth daily.    [provider]  levOCARNitine (L-CARNITINE) 500 MG TABS Take 1 tablet by mouth daily.    [provider]  Melatonin 5 MG TABS Take 5 mg by mouth at bedtime as needed (sleep).    [provider]  nebivolol (BYSTOLIC) 10 MG tablet Take 10 mg by mouth 2 (two) times a day.    [provider]  olmesartan (BENICAR) 20 MG tablet Take 20 mg by mouth 2 (two) times a day.    [provider]  omeprazole (PRILOSEC) 40 MG capsule Take 40 mg by mouth daily as needed (for acid reflux/heartburn).    [provider]  Potassium 99 MG TABS Take 99 mg by mouth daily. May take an additional dose in the evening if experiencing cramping    [provider]   spironolactone (ALDACTONE) 25 MG tablet Take 25 mg by mouth daily. 07/21/19   [provider]  UNABLE TO FIND Med Name: Lorenza Chick root to help prevent or dissolve kidney stones    [provider]  vitamin C (ASCORBIC ACID) 500 MG tablet  Take 500 mg by mouth daily as needed (Cold season).     [provider]     Allergies:     Allergies  Allergen Reactions  . Gabapentin Other (See Comments)    dizziness  . Lisinopril Cough  . Nortriptyline Other (See Comments)    Increased numbness     Physical Exam:   Vitals  Blood pressure 124/78, pulse 83, temperature 98.6 F (37 C), temperature source Oral, resp. rate 18, height 5\' 11"  (1.803 m), weight 108.9 kg, SpO2 97 %.  1.  General: Appears in no acute distress  2. Psychiatric: Alert, oriented x3, intact insight and judgment  3. Neurologic: Cranial nerves II through XII grossly intact, moving all extremities  4. HEENMT:  Atraumatic normocephalic, extraocular muscles are intact  5. Respiratory : Clear to auscultation bilaterally, no wheezing or crackles  6. Cardiovascular : S1-S2, regular, no murmur auscultated  7. Gastrointestinal:  Abdomen is soft, nontender, distended, no organomegaly  8. Skin:  Left great toe is edematous, deep ulcer noted at the plantar aspect of big toe, minimal discharge, appears macerated, nontender to palpation, s/p second toe amputation.  Tenderness noted at the medial aspect of left lower extremity.  Peripheral pulses are palpable bilaterally.      Data Review:    CBC Recent Labs  Lab 07/28/19 2053  WBC 11.6*  HGB 17.2*  HCT 52.0  PLT 256  MCV 87.2  MCH 28.9  MCHC 33.1  RDW 15.2  LYMPHSABS 2.2  MONOABS 1.2*  EOSABS 0.3  BASOSABS 0.1   ------------------------------------------------------------------------------------------------------------------  Results for orders placed or performed during the hospital encounter of 07/28/19 (from the past 48  hour(s))  CBC with Differential     Status: Abnormal   Collection Time: 07/28/19  8:53 PM  Result Value Ref Range   WBC 11.6 (H) 4.0 - 10.5 K/uL   RBC 5.96 (H) 4.22 - 5.81 MIL/uL   Hemoglobin 17.2 (H) 13.0 - 17.0 g/dL   HCT 52.0 39.0 - 52.0 %   MCV 87.2 80.0 - 100.0 fL   MCH 28.9 26.0 - 34.0 pg   MCHC 33.1 30.0 - 36.0 g/dL   RDW 15.2 11.5 - 15.5 %   Platelets 256 150 - 400 K/uL   nRBC 0.0 0.0 - 0.2 %   Neutrophils Relative % 66 %   Neutro Abs 7.8 (H) 1.7 - 7.7 K/uL   Lymphocytes Relative 19 %   Lymphs Abs 2.2 0.7 - 4.0 K/uL   Monocytes Relative 11 %   Monocytes Absolute 1.2 (H) 0.1 - 1.0 K/uL   Eosinophils Relative 2 %   Eosinophils Absolute 0.3 0.0 - 0.5 K/uL   Basophils Relative 1 %   Basophils Absolute 0.1 0.0 - 0.1 K/uL   Immature Granulocytes 1 %   Abs Immature Granulocytes 0.08 (H) 0.00 - 0.07 K/uL    Comment: Performed at [facility], [address], [place], [place] [id number]  Basic metabolic panel     Status: Abnormal   Collection Time: 07/28/19  8:53 PM  Result Value Ref Range   Sodium 136 135 - 145 mmol/L   Potassium 4.1 3.5 - 5.1 mmol/L   Chloride 100 98 - 111 mmol/L   CO2 24 22 - 32 mmol/L   Glucose, Bld 127 (H) 70 - 99 mg/dL   BUN 25 (H) 6 - 20 mg/dL   Creatinine, Ser 1.24 0.61 - 1.24 mg/dL   Calcium 9.3 8.9 - 10.3 mg/dL   GFR calc non  Af Amer >60 >60 mL/min   GFR calc Af Amer >60 >60 mL/min   Anion gap 12 5 - 15    Comment: Performed at [facility], [address], [place], [place] [address]  Sedimentation rate     Status: None   Collection Time: 07/28/19  8:53 PM  Result Value Ref Range   Sed Rate 4 0 - 16 mm/hr    Comment: Performed at [facility], [address] [place], [place] [address]  C-reactive protein     Status: Abnormal   Collection Time: 07/28/19  8:53 PM  Result Value Ref Range   CRP 8.6 (H) <1.0 mg/dL    Comment: Performed at [facility], [address] [place], [place] [address]    Chemistries  Recent Labs  Lab 07/28/19 2053   NA 136  K 4.1  CL 100  CO2 24  GLUCOSE 127*  BUN 25*  CREATININE 1.24  CALCIUM 9.3       Imaging Results:    Dg Foot Complete Left  Result Date: 07/28/2019 CLINICAL DATA:  Left great toe infection for a month. EXAM: LEFT FOOT - COMPLETE 3+ VIEW COMPARISON:  None. FINDINGS: Great toe is swollen. Air is seen in the distal soft tissues. Possible erosion of the distal tuft. The second toes been amputated. The remainder of the toes are normal. Contour abnormality at the base of the third metatarsal. Metatarsals are otherwise normal. Degenerative changes in the midfoot. IMPRESSION: 1. Swollen soft tissues in the great toe. Air in the soft tissues consistent with history of infection. The soft tissue air could be from ulceration or a gas forming organism in the soft tissues. 2. Possible mild erosion in the lateral aspect of the distal first tuft suspicious for subtle osteomyelitis. This is not seen on other views. An MRI could better assess to confirm osteomyelitis. 3. Contour abnormality at the base of the third metatarsal. This region was not as well seen previously. This is not in close proximity to the known infection and the finding could be artifactual. Recommend attention to this region on the MRI if performed. Otherwise, recommend clinical correlation. Electronically Signed   By: Dorise Bullion III M.D   On: 07/28/2019 21:13       Assessment & Plan:    Active Problems:   Left foot infection   1. Left great toe infection-concern for osteomyelitis as per x-ray of the foot.  Will start vancomycin and Zosyn per pharmacy consultation, will obtain MRI of the left foot.  2. Left leg cellulitis-started on Vanco and Zosyn as above.  3. Hypertension-continue Coreg, hydrochlorothiazide, Benicar, Aldactone.  4. GERD continue Protonix 40 mg daily.  5. Anxiety/depression-continue bupropion  6. Diarrhea-likely antibiotic induced, no fever.  WBC 11.6.  If continues to have diarrhea next 24  hours, consider checking C. difficile PCR.    DVT Prophylaxis-   Lovenox   AM Labs Ordered, also please review Full Orders  Family Communication: Admission, patients condition and plan of care including tests being ordered have been discussed with the patient who indicate understanding and agree with the plan and Code Status.  Code Status: Full code  Admission status: Inpatient: Based on patients clinical presentation and evaluation of above clinical data, I have made determination that patient meets Inpatient criteria at this time.  Time spent in minutes : 60 min   Oswald Hillock M.D on 07/29/2019 at 1:21 AM

## 2019-07-29 NOTE — Progress Notes (Signed)
Pharmacy Antibiotic Note  Jacob Munoz is a 58 y.o. male admitted on 07/28/2019 with L great toe infection. Noted pt with h/o prior osteo of L second toe requiring amputation.  Pharmacy has been consulted for Vancomycin and Zosyn dosing.  Plan: Zosyn 3.375gm IV now over 33min then q8h - subsequent doses over 4 hours. Vancomycin 2000 mg IV load then 1750 mg Q 24 hrs. Goal AUC 400-550. Expected AUC: 514 SCr used: 1.24 Will f/u renal function, micro data, and pt's clinical condition Vanc levels prn   Height: 5\' 11"  (180.3 cm) Weight: 240 lb (108.9 kg) IBW/kg (Calculated) : 75.3  Temp (24hrs), Avg:98.6 F (37 C), Min:98.6 F (37 C), Max:98.6 F (37 C)  Recent Labs  Lab 07/28/19 2053  WBC 11.6*  CREATININE 1.24    Estimated Creatinine Clearance: 82.5 mL/min (by C-G formula based on SCr of 1.24 mg/dL).    Allergies  Allergen Reactions  . Gabapentin Other (See Comments)    dizziness  . Lisinopril Cough  . Nortriptyline Other (See Comments)    Increased numbness    Antimicrobials this admission: 11/13 Vanco >>  11/13 Zosyn >>   Microbiology results: 11/12 SARS coronavirus:   Thank you for allowing pharmacy to be a part of this patient's care.  Sherlon Handing, PharmD, BCPS CGV Clinical pharmacist phone [phone] 07/29/2019 1:34 AM

## 2019-07-30 ENCOUNTER — Inpatient Hospital Stay: Payer: Self-pay

## 2019-07-30 LAB — COMPREHENSIVE METABOLIC PANEL
ALT: 30 U/L (ref 0–44)
AST: 21 U/L (ref 15–41)
Albumin: 3.7 g/dL (ref 3.5–5.0)
Alkaline Phosphatase: 71 U/L (ref 38–126)
Anion gap: 11 (ref 5–15)
BUN: 20 mg/dL (ref 6–20)
CO2: 24 mmol/L (ref 22–32)
Calcium: 8.8 mg/dL — ABNORMAL LOW (ref 8.9–10.3)
Chloride: 105 mmol/L (ref 98–111)
Creatinine, Ser: 1.21 mg/dL (ref 0.61–1.24)
GFR calc Af Amer: >60 mL/min (ref >60)
GFR calc non Af Amer: >60 mL/min (ref >60)
Glucose, Bld: 156 mg/dL — ABNORMAL HIGH (ref 70–99)
Potassium: 4.8 mmol/L (ref 3.5–5.1)
Sodium: 140 mmol/L (ref 135–145)
Total Bilirubin: 0.9 mg/dL (ref 0.3–1.2)
Total Protein: 7.4 g/dL (ref 6.5–8.1)

## 2019-07-30 LAB — CBC WITH DIFFERENTIAL/PLATELET
Abs Immature Granulocytes: 0.07 10*3/uL (ref 0.00–0.07)
Basophils Absolute: 0 10*3/uL (ref 0.0–0.1)
Basophils Relative: 0 %
Eosinophils Absolute: 0.3 10*3/uL (ref 0.0–0.5)
Eosinophils Relative: 4 %
HCT: 48.9 % (ref 39.0–52.0)
Hemoglobin: 16 g/dL (ref 13.0–17.0)
Immature Granulocytes: 1 %
Lymphocytes Relative: 22 %
Lymphs Abs: 2 10*3/uL (ref 0.7–4.0)
MCH: 29 pg (ref 26.0–34.0)
MCHC: 32.7 g/dL (ref 30.0–36.0)
MCV: 88.7 fL (ref 80.0–100.0)
Monocytes Absolute: 1.1 10*3/uL — ABNORMAL HIGH (ref 0.1–1.0)
Monocytes Relative: 12 %
Neutro Abs: 5.6 10*3/uL (ref 1.7–7.7)
Neutrophils Relative %: 61 %
Platelets: 245 10*3/uL (ref 150–400)
RBC: 5.51 MIL/uL (ref 4.22–5.81)
RDW: 14.6 % (ref 11.5–15.5)
WBC: 9.1 10*3/uL (ref 4.0–10.5)
nRBC: 0 % (ref 0.0–0.2)

## 2019-07-30 LAB — MAGNESIUM: Magnesium: 2.1 mg/dL (ref 1.7–2.4)

## 2019-07-30 LAB — MRSA PCR SCREENING: MRSA by PCR: NEGATIVE

## 2019-07-30 MED ORDER — LORATADINE 10 MG PO TABS
10.0000 mg | ORAL_TABLET | Freq: Every day | ORAL | Status: DC
  Administered 2019-07-30 – 2019-07-31 (×2): 10 mg via ORAL
  Filled 2019-07-30 (×2): qty 1

## 2019-07-30 MED ORDER — SODIUM CHLORIDE 0.9 % IV SOLN
INTRAVENOUS | Status: AC
  Administered 2019-07-30: 09:00:00 via INTRAVENOUS

## 2019-07-30 MED ORDER — CEFEPIME IV (FOR PTA / DISCHARGE USE ONLY): 2.0000 g | Freq: Three times a day (TID) | INTRAVENOUS | 0 refills | Status: AC

## 2019-07-30 MED ORDER — SACCHAROMYCES BOULARDII 250 MG PO CAPS: 250.0000 mg | ORAL_CAPSULE | Freq: Two times a day (BID) | ORAL | 0 refills | Status: AC

## 2019-07-30 MED ORDER — VANCOMYCIN HCL 10 G IV SOLR
1750.0000 mg | INTRAVENOUS | Status: DC
  Administered 2019-07-31: 06:00:00 1750 mg via INTRAVENOUS
  Filled 2019-07-30 (×2): qty 1750

## 2019-07-30 MED ORDER — VANCOMYCIN IV (FOR PTA / DISCHARGE USE ONLY): 1750.0000 mg | INTRAVENOUS | 0 refills | Status: AC

## 2019-07-30 NOTE — Discharge Summary (Signed)
Physician Discharge Summary  Jacob Munoz XBL:[id number] DOB: 16-Sep-1960 DOA: 07/28/2019  PCP: Jacob Butcher, NP Podiatry surgeon: Jacob Munoz  Admit date: 07/28/2019 Discharge date: 07/30/2019  Admitted From:  Home  Disposition: Home   Recommendations for Outpatient Follow-up:  1. Follow up with Dr. Posey Munoz on 08/02/19 as scheduled 2. HOME IV ANTIBIOTICS AS ORDERED 3. FOLLOW UP WOUND CULTURES WITH Jacob Munoz  4. ADJUST ANTIBIOTICS IF NEEDED BASED ON CULTURE AND SENSITIVITIES   Home Health:  RN for home IV antibiotics and PICC line care   Discharge Condition: STABLE   CODE STATUS: FULL    Brief Hospitalization Summary: Please see all hospital notes, images, labs for full details of the hospitalization. Dr. Toney Munoz HPI: Jacob Munoz  is a 58 y.o. 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 Patient says that he developed a callus on his inferior left great toe approximately 1 month ago which became infected, since that time he has had chronic infection and swelling of the toe after multiple rounds of antibiotics.  Patient says initially was placed on Cipro and clindamycin, when infection did not respond.  He was switched to doxycycline with some improvement but after the antibiotics was finished the swelling and redness returned.  He was seen by his podiatrist Dr. Posey Munoz in his office and was sent for MRI of the foot with IV antibiotics for failed outpatient treatment. Patient denies any pain, he has peripheral neuropathy.  Patient also developed redness along the medial aspect of left lower extremity which is tender to palpation. He denies any fever but felt hot with sweating yesterday. He also complains of diarrhea since he started taking clindamycin.  He  had 2 loose BM today. In the ED x-ray of the foot showed swollen soft tissue in the great toe.  Air in the soft tissue consistent with history of infection.  Possible mild erosion in the lateral aspect of distal first tuft suspicious for subtle osteomyelitis.  He denies abdominal pain but complains of feeling of a protruding mass in the left upper quadrant which appeared few days ago.  Denies any swelling or pain at this time. Denies chest pain or shortness of breath. 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  MDM/Assessment & Plan:   1. Osteomyelitis of left 1st toe - MRI confirms osteomyelitis.  Pt had discussion with surgery and opted for 6-8 weeks of IV antibiotics.  ID recommended empiric treatment until culture and sensitivities are available.   Cultures reportedly done at Dr. Serita Munoz office but no results are available yet.  PICC line was placed and patient requested to go home to complete 8 week course of IV antibiotics.  Home health transfusion services arranged by care management prior to discharge. Instructions provided to patient who verbalizes understanding.  2. Left leg cellulitis - mild - improving with IV antibiotics.  3. Essential hypertension -resuming home meds, blood pressures well controlled. 4. GERD - continue protonix daily for GI protection.  5. GAD - resume home meds   DVT prophylaxis: lovenox  Code Status: Full  Family Communication: patient updated at bedside Disposition Plan: home with IV antibiotics after PICC placed   Consultants:  Surgery Dr. Posey Munoz   Procedures:    Antimicrobials:  Vancomycin   zosyn    Discharge Diagnoses:  Active Problems:   Left  foot infection   Cellulitis of left lower extremity   Wound infection   Discharge Instructions: Discharge Instructions    Home infusion instructions [facility] May follow [place] Dosing Protocol; May administer Cathflo as needed to maintain patency of vascular access device.; Flushing of vascular access device: per [facility] Protocol: 0.9% NaCl pre/post medica...   Complete by: As directed    Instructions: May follow [place] Dosing Protocol   Instructions: May administer Cathflo as needed to maintain patency of vascular access device.   Instructions: Flushing of vascular access device: per [facility] Protocol: 0.9% NaCl pre/post medication administration and prn patency; Heparin 100 u/ml, 44m for implanted ports and Heparin 10u/ml, 539mfor all other central venous catheters.   Instructions: May follow [facility] Anaphylaxis Protocol for First Dose Administration in the home: 0.9% NaCl at 25-50 ml/hr to maintain IV access for protocol meds. Epinephrine 0.3 ml IV/IM PRN and Benadryl 25-50 IV/IM PRN s/s of anaphylaxis.   Instructions: [facility] Coordinator (RN) to assist per patient IV care needs in the home PRN.     Allergies as of 07/30/2019      Reactions   Gabapentin Other (See Comments)   dizziness   Lisinopril Cough   Nortriptyline Other (See Comments)   Increased numbness      Medication List    STOP taking these medications   ciprofloxacin 500 MG tablet Commonly known as: CIPRO   clindamycin 300 MG capsule Commonly known as: CLEOCIN   doxycycline 100 MG capsule Commonly known as: VIBRAMYCIN     TAKE these medications   acetaminophen 500 MG tablet Commonly known as: TYLENOL Take 500-1,000 mg by mouth every 6 (six) hours as needed (for pain.).   buPROPion 150 MG 12 hr tablet Commonly known as: WELLBUTRIN SR Take 150 mg by mouth 2 (two) times daily.   carvedilol 25 MG tablet Commonly known as: COREG Take 25 mg by mouth 2 (two) times daily.    ceFEPime  IVPB Commonly known as: MAXIPIME Inject 2 g into the vein every 8 (eight) hours. Indication: osteomyelitis Last Day of Therapy:  09/23/2019 Labs - Once weekly:  CBC/D and BMP, Labs - Every other week:  ESR and CRP   Chromium Picolinate 1000 MCG Tabs Take by mouth.   hydrochlorothiazide 25 MG tablet Commonly known as: HYDRODIURIL Take 25 mg by mouth daily.   ibuprofen 200 MG tablet Commonly known as: ADVIL Take 200-400 mg by mouth 2 (two) times daily as needed (for pain/numbness).   Melatonin 5 MG Tabs Take 5 mg by mouth at bedtime as needed (sleep).   olmesartan 20 MG tablet Commonly known as: BENICAR Take 20 mg by mouth 2 (two) times a day.   omeprazole 40 MG capsule Commonly known as: PRILOSEC Take 40 mg by mouth daily as needed (for acid reflux/heartburn).   Potassium 99 MG Tabs Take 99 mg by mouth daily. May take an additional dose in the evening if experiencing cramping   saccharomyces boulardii 250 MG capsule Commonly known as: FLORASTOR Take 1 capsule (250 mg total) by mouth 2 (two) times daily.   spironolactone 25 MG tablet Commonly known as: ALDACTONE Take 25 mg by mouth daily.   UNABLE TO  FIND Med Name: hydrangea root to help prevent or dissolve kidney stones   vancomycin  IVPB Inject 1,750 mg into the vein daily. Indication:  osteomyelitis Last Day of Therapy:  09/23/2019 Labs - 'Sunday/Monday:  CBC/D, BMP, and vancomycin trough. Labs - Thursday:  BMP and vancomycin trough Labs - Every other week:  ESR and CRP            Home Infusion Instuctions  (From admission, onward)         Start     Ordered   07/30/19 0000  Home infusion instructions [facility] May follow [facility] Dosing Protocol; May administer Cathflo as needed to maintain patency of vascular access device.; Flushing of vascular access device: per [facility] Protocol: 0.9% NaCl pre/post medica...    Question Answer Comment  Instructions May follow [facility] Dosing  Protocol   Instructions May administer Cathflo as needed to maintain patency of vascular access device.   Instructions Flushing of vascular access device: per [facility] Protocol: 0.9% NaCl pre/post medication administration and prn patency; Heparin 100 u/ml, 5ml for implanted ports and Heparin 10u/ml, 5ml for all other central venous catheters.   Instructions May follow [facility] Anaphylaxis Protocol for First Dose Administration in the home: 0.9% NaCl at 25-50 ml/hr to maintain IV access for protocol meds. Epinephrine 0.3 ml IV/IM PRN and Benadryl 25-50 IV/IM PRN s/s of anaphylaxis.   Instructions [facility] Infusion Coordinator (RN) to assist per patient IV care needs in the home PRN.      11'$ /14/20 1403         Follow-up Information    Tyson Babinski, DPM. Go on 08/02/2019.   Specialty: Podiatry Why: appointment as scheduled Contact information: [place] [address] [phone]        Jacob Butcher, NP. Schedule an appointment as soon as possible for a visit in 2 week(s).   Specialty: Nurse Practitioner Contact information: [address] [place] [address] [phone]          Allergies  Allergen Reactions  . Gabapentin Other (See Comments)    dizziness  . Lisinopril Cough  . Nortriptyline Other (See Comments)    Increased numbness   Allergies as of 07/30/2019      Reactions   Gabapentin Other (See Comments)   dizziness   Lisinopril Cough   Nortriptyline Other (See Comments)   Increased numbness      Medication List    STOP taking these medications   ciprofloxacin 500 MG tablet Commonly known as: CIPRO   clindamycin 300 MG capsule Commonly known as: CLEOCIN   doxycycline 100 MG capsule Commonly known as: VIBRAMYCIN     TAKE these medications   acetaminophen 500 MG tablet Commonly known as: TYLENOL Take 500-1,000 mg by mouth every 6 (six) hours as needed (for pain.).   buPROPion 150 MG 12 hr tablet Commonly known as:  WELLBUTRIN SR Take 150 mg by mouth 2 (two) times daily.   carvedilol 25 MG tablet Commonly known as: COREG Take 25 mg by mouth 2 (two) times daily.   ceFEPime  IVPB Commonly known as: MAXIPIME Inject 2 g into the vein every 8 (eight) hours. Indication: osteomyelitis Last Day of Therapy:  09/23/2019 Labs - Once weekly:  CBC/D and BMP, Labs - Every other week:  ESR and CRP   Chromium Picolinate 1000 MCG Tabs Take by mouth.   hydrochlorothiazide 25 MG tablet Commonly known as: HYDRODIURIL Take 25 mg by mouth daily.  ibuprofen 200 MG tablet Commonly known as: ADVIL Take 200-400 mg by mouth 2 (two) times daily as needed (for pain/numbness).   Melatonin 5 MG Tabs Take 5 mg by mouth at bedtime as needed (sleep).   olmesartan 20 MG tablet Commonly known as: BENICAR Take 20 mg by mouth 2 (two) times a day.   omeprazole 40 MG capsule Commonly known as: PRILOSEC Take 40 mg by mouth daily as needed (for acid reflux/heartburn).   Potassium 99 MG Tabs Take 99 mg by mouth daily. May take an additional dose in the evening if experiencing cramping   saccharomyces boulardii 250 MG capsule Commonly known as: FLORASTOR Take 1 capsule (250 mg total) by mouth 2 (two) times daily.   spironolactone 25 MG tablet Commonly known as: ALDACTONE Take 25 mg by mouth daily.   UNABLE TO FIND Med Name: hydrangea root to help prevent or dissolve kidney stones   vancomycin  IVPB Inject 1,750 mg into the vein daily. Indication:  osteomyelitis Last Day of Therapy:  09/23/2019 Labs - 'Sunday/Monday:  CBC/D, BMP, and vancomycin trough. Labs - Thursday:  BMP and vancomycin trough Labs - Every other week:  ESR and CRP            Home Infusion Instuctions  (From admission, onward)         Start     Ordered   07/30/19 0000  Home infusion instructions [facility] May follow [facility] Dosing Protocol; May administer Cathflo as needed to maintain patency of vascular access device.;  Flushing of vascular access device: per [facility] Protocol: 0.9% NaCl pre/post medica...    Question Answer Comment  Instructions May follow [facility] Dosing Protocol   Instructions May administer Cathflo as needed to maintain patency of vascular access device.   Instructions Flushing of vascular access device: per [facility] Protocol: 0.9% NaCl pre/post medication administration and prn patency; Heparin 100 u/ml, 5ml for implanted ports and Heparin 10u/ml, 5ml for all other central venous catheters.   Instructions May follow [facility] Anaphylaxis Protocol for First Dose Administration in the home: 0.9% NaCl at 25-50 ml/hr to maintain IV access for protocol meds. Epinephrine 0.3 ml IV/IM PRN and Benadryl 25-50 IV/IM PRN s/s of anaphylaxis.   Instructions [facility] Infusion Coordinator (RN) to assist per patient IV care needs in the home PRN.      11'$ /14/20 1403          Procedures/Studies: Mr Foot Left Wo Contrast  Result Date: 07/29/2019 CLINICAL DATA:  Left great toe infection, swelling, diabetes EXAM: MRI OF THE LEFT FOOT WITHOUT CONTRAST TECHNIQUE: Multiplanar, multisequence MR imaging of the left forefoot was performed. No intravenous contrast was administered. COMPARISON:  X-ray 07/28/2019, 06/15/2018 FINDINGS: Bones/Joint/Cartilage Cortical erosion of the distal tuft of the great toe with bone marrow edema throughout the great toe distal phalanx. There is confluent low T1 signal changes throughout the distal tuft and along the lateral margin of the distal phalanx closely approximating the IP joint (series 6, image 18). There is mild edema like marrow signal within the great toe proximal phalanx adjacent to the IP joint without T1 signal changes to suggest osteomyelitis. Patient is status post second digit amputation at the level of the neck of the second digit proximal phalanx. Subtle T2 hyperintense signal at the resection margin (series 7, image 10) without associated T1 signal abnormality. Marrow  edema within the proximal metaphyses of the second, third, and fourth metatarsals without definite linear low signal intensity fracture line (  series 5, images 7-9). There are small marginal erosions centered at the TMT joints (for example series 6, images 8-10). Patchy marrow edema throughout the bones of the midfoot including the navicular and cuneiforms. Dorsal proliferative changes throughout the midfoot. Ligaments Intrinsic foot ligaments including the Lisfranc ligament are intact. Muscles and Tendons Diffuse muscle edema and fatty infiltration of the intrinsic foot musculature. Flexor and extensor tendons are grossly intact. No tenosynovial fluid collection. Soft tissues There is soft tissue swelling with skin thickening and subcutaneous edema at the level of the great toe distal phalanx. Mild subcutaneous edema over the dorsum of the foot. No well-defined or drainable fluid collections. IMPRESSION: 1. Cortical erosion and marrow signal changes within the great toe distal phalanx compatible with acute osteomyelitis. 2. Mild marrow edema within the distal aspect of the great toe proximal phalanx without associated T1 signal change. Findings favored to represent reactive osteitis without evidence of osteomyelitis. No joint effusion at the great toe IP joint to suggest septic arthritis. 3. Prominent marrow edema within the proximal metaphyses of the second, third, and fourth metatarsals without discrete fracture line. Findings are favored to represent stress related changes. 4. Patchy milder marrow edema throughout the bones of the midfoot with scattered small marginal erosions within the midfoot and at the second-fourth metatarsal bases. Findings are nonspecific and may represent developing Charcot arthropathy versus an inflammatory arthropathy. Infection in this area is not excluded although felt to be less likely given the lack of changes within the adjacent soft tissues. 5. Diffuse muscle edema and fatty  infiltration of the intrinsic foot musculature most consistent with diabetic neuropathy. Electronically Signed   By: Davina Poke M.D.   On: 07/29/2019 09:44   Dg Foot Complete Left  Result Date: 07/28/2019 CLINICAL DATA:  Left great toe infection for a month. EXAM: LEFT FOOT - COMPLETE 3+ VIEW COMPARISON:  None. FINDINGS: Great toe is swollen. Air is seen in the distal soft tissues. Possible erosion of the distal tuft. The second toes been amputated. The remainder of the toes are normal. Contour abnormality at the base of the third metatarsal. Metatarsals are otherwise normal. Degenerative changes in the midfoot. IMPRESSION: 1. Swollen soft tissues in the great toe. Air in the soft tissues consistent with history of infection. The soft tissue air could be from ulceration or a gas forming organism in the soft tissues. 2. Possible mild erosion in the lateral aspect of the distal first tuft suspicious for subtle osteomyelitis. This is not seen on other views. An MRI could better assess to confirm osteomyelitis. 3. Contour abnormality at the base of the third metatarsal. This region was not as well seen previously. This is not in close proximity to the known infection and the finding could be artifactual. Recommend attention to this region on the MRI if performed. Otherwise, recommend clinical correlation. Electronically Signed   By: Dorise Bullion III M.D   On: 07/28/2019 21:13   [place] Ekg Site Rite  Result Date: 07/30/2019 If Site Rite image not attached, placement could not be confirmed due to current cardiac rhythm.     Subjective: Pt says leg and toe feeling a little better and has been ambulating.  Feels ok to go home with PICC line and continue IV antibiotics at home.    Discharge Exam: Vitals:   07/30/19 0506 07/30/19 0856  BP: 124/73   Pulse: 68 70  Resp: 18 18  Temp: (!) 97.4 F (36.3 C)   SpO2: 97% 99%   Vitals:  07/29/19 2130 07/29/19 2259 07/30/19 0506 07/30/19 0856  BP:  127/78  124/73   Pulse: 75  68 70  Resp: '18  18 18  '$ Temp: 98.2 F (36.8 C)  (!) 97.4 F (36.3 C)   TempSrc: Oral  Oral   SpO2: 97% 97% 97% 99%  Weight:      Height:       General exam: awake, alert, NAD, cooperative.  Respiratory system: Clear. No increased work of breathing. Cardiovascular system: S1 & S2 heard. No JVD, murmurs, gallops, clicks or pedal edema. Gastrointestinal system: Abdomen is nondistended, soft and nontender. Normal bowel sounds heard. Central nervous system: Alert and oriented. No focal neurological deficits. Extremities: swollen red left 1st toe, no drainage seen. The results of significant diagnostics from this hospitalization (including imaging, microbiology, ancillary and laboratory) are listed below for reference.     Microbiology: Recent Results (from the past 240 hour(s))  SARS CORONAVIRUS 2 (TAT 6-24 HRS) Nasopharyngeal Nasopharyngeal Swab     Status: None   Collection Time: 07/28/19  8:37 PM   Specimen: Nasopharyngeal Swab  Result Value Ref Range Status   SARS Coronavirus 2 NEGATIVE NEGATIVE Final    Comment: (NOTE) SARS-CoV-2 target nucleic acids are NOT DETECTED. The SARS-CoV-2 RNA is generally detectable in upper and lower respiratory specimens during the acute phase of infection. Negative results do not preclude SARS-CoV-2 infection, do not rule out co-infections with other pathogens, and should not be used as the sole basis for treatment or other patient management decisions. Negative results must be combined with clinical observations, patient history, and epidemiological information. The expected result is Negative. Fact Sheet for Patients: SugarRoll.be Fact Sheet for Healthcare Providers: https://www.woods-mathews.com/ This test is not yet approved or cleared by the [place] FDA and  has been authorized for detection and/or diagnosis of SARS-CoV-2 by FDA under an Emergency Use Authorization  (EUA). This EUA will remain  in effect (meaning this test can be used) for the duration of the COVID-19 declaration under Section 56 4(b)(1) of the Act, 21 U.S.C. section 360bbb-3(b)(1), unless the authorization is terminated or revoked sooner. Performed at [facility], [place] [address], [place], [place] [address]   MRSA PCR Screening     Status: None   Collection Time: 07/30/19  8:26 AM   Specimen: Nasopharyngeal  Result Value Ref Range Status   MRSA by PCR NEGATIVE NEGATIVE Final    Comment:        The GeneXpert MRSA Assay (FDA approved for NASAL specimens only), is one component of a comprehensive MRSA colonization surveillance program. It is not intended to diagnose MRSA infection nor to guide or monitor treatment for MRSA infections. Performed at [facility], [address] [place], [place] [address]      Labs: BNP (last 3 results) No results for input(s): BNP in the last 8760 hours. Basic Metabolic Panel: Recent Labs  Lab 07/28/19 2053 07/29/19 0655 07/30/19 0846  NA 136 135 140  K 4.1 3.8 4.8  CL 100 105 105  CO2 24 21* 24  GLUCOSE 127* 181* 156*  BUN 25* 24* 20  CREATININE 1.24 1.32* 1.21  CALCIUM 9.3 8.3* 8.8*  MG  --   --  2.1   Liver Function Tests: Recent Labs  Lab 07/29/19 0655 07/30/19 0846  AST 16 21  ALT 28 30  ALKPHOS 78 71  BILITOT 0.9 0.9  PROT 7.1 7.4  ALBUMIN 3.5 3.7   No results for input(s): LIPASE, AMYLASE in the  last 168 hours. No results for input(s): AMMONIA in the last 168 hours. CBC: Recent Labs  Lab 07/28/19 2053 07/29/19 0655 07/30/19 0846  WBC 11.6* 8.5 9.1  NEUTROABS 7.8*  --  5.6  HGB 17.2* 16.1 16.0  HCT 52.0 48.2 48.9  MCV 87.2 87.0 88.7  PLT 256 226 245   Cardiac Enzymes: No results for input(s): CKTOTAL, CKMB, CKMBINDEX, TROPONINI in the last 168 hours. BNP: Invalid input(s): POCBNP CBG: No results for input(s): GLUCAP in the last 168 hours. D-Dimer No results for input(s): DDIMER in the last  72 hours. Hgb A1c No results for input(s): HGBA1C in the last 72 hours. Lipid Profile No results for input(s): CHOL, HDL, LDLCALC, TRIG, CHOLHDL, LDLDIRECT in the last 72 hours. Thyroid function studies No results for input(s): TSH, T4TOTAL, T3FREE, THYROIDAB in the last 72 hours.  Invalid input(s): FREET3 Anemia work up No results for input(s): VITAMINB12, FOLATE, FERRITIN, TIBC, IRON, RETICCTPCT in the last 72 hours. Urinalysis No results found for: COLORURINE, APPEARANCEUR, [place], [place], [place], [place], [place], [place], PROTEINUR, UROBILINOGEN, NITRITE, LEUKOCYTESUR Sepsis Labs Invalid input(s): PROCALCITONIN,  WBC,  LACTICIDVEN Microbiology Recent Results (from the past 240 hour(s))  SARS CORONAVIRUS 2 (TAT 6-24 HRS) Nasopharyngeal Nasopharyngeal Swab     Status: None   Collection Time: 07/28/19  8:37 PM   Specimen: Nasopharyngeal Swab  Result Value Ref Range Status   SARS Coronavirus 2 NEGATIVE NEGATIVE Final    Comment: (NOTE) SARS-CoV-2 target nucleic acids are NOT DETECTED. The SARS-CoV-2 RNA is generally detectable in upper and lower respiratory specimens during the acute phase of infection. Negative results do not preclude SARS-CoV-2 infection, do not rule out co-infections with other pathogens, and should not be used as the sole basis for treatment or other patient management decisions. Negative results must be combined with clinical observations, patient history, and epidemiological information. The expected result is Negative. Fact Sheet for Patients: SugarRoll.be Fact Sheet for Healthcare Providers: https://www.woods-mathews.com/ This test is not yet approved or cleared by the [place] FDA and  has been authorized for detection and/or diagnosis of SARS-CoV-2 by FDA under an Emergency Use Authorization (EUA). This EUA will remain  in effect (meaning this test can be used) for the duration of the COVID-19  declaration under Section 56 4(b)(1) of the Act, 21 U.S.C. section 360bbb-3(b)(1), unless the authorization is terminated or revoked sooner. Performed at [facility], [place] [address] [place], [place] [address]   MRSA PCR Screening     Status: None   Collection Time: 07/30/19  8:26 AM   Specimen: Nasopharyngeal  Result Value Ref Range Status   MRSA by PCR NEGATIVE NEGATIVE Final    Comment:        The GeneXpert MRSA Assay (FDA approved for NASAL specimens only), is one component of a comprehensive MRSA colonization surveillance program. It is not intended to diagnose MRSA infection nor to guide or monitor treatment for MRSA infections. Performed at [facility], [address] [place], [place] [address]    Time coordinating discharge: 32 minutes   SIGNED:  Irwin Brakeman, MD  [facility] 07/30/2019, 2:05 PM How to contact the [facility] Attending or Consulting provider [place] or covering provider during after hours [place], for this patient?  1. Check the care team in [facility] and look for a) attending/consulting TRH provider listed and b) the [facility] team listed 2. Log into [email or web address] and use [place]'s universal password to access. If you do not have the password,  please contact the hospital operator. 3. Locate the [facility] provider you are looking for under [facility] and page to a number that you can be directly reached. 4. If you still have difficulty reaching the provider, please page the [facility] (Director on Call) for the Hospitalists listed on amion for assistance.

## 2019-07-30 NOTE — TOC Initial Note (Signed)
Transition of Care [facility]) - Initial/Assessment Note    Patient Details  Name: Jacob Munoz MRN: [id number] Date of Birth: 1961-08-23  Transition [facility] Care [facility] CM/SW Contact:    Latanya Maudlin, RN Phone Number: 07/30/2019, 1:44 PM  Clinical Narrative:  TOC received consult for long term IV abx. OPAT is in. Patient awaiting PICC placement. I have notified [facility] of [facility]. TOC will continue to follow and assist with disposition.                  Expected Discharge Plan: [place] Barriers to Discharge: Continued Medical Work up   Patient Goals and CMS Choice        Expected Discharge Plan and Services Expected Discharge Plan: [place]   Discharge Planning Services: CM Consult Post Acute Care Choice: Home Health                   DME Arranged: IV pump/equipment                    Prior Living Arrangements/Services                       Activities of Daily Living Home Assistive Devices/Equipment: None ADL Screening (condition at time of admission) Patient's cognitive ability adequate to safely complete daily activities?: Yes Is the patient deaf or have difficulty hearing?: No Does the patient have difficulty seeing, even when wearing glasses/contacts?: No Does the patient have difficulty concentrating, remembering, or making decisions?: No Patient able to express need for assistance with ADLs?: Yes Does the patient have difficulty dressing or bathing?: No Independently performs ADLs?: Yes (appropriate for developmental age) Does the patient have difficulty walking or climbing stairs?: No Weakness of Legs: None Weakness of Arms/Hands: None  Permission Sought/Granted                  Emotional Assessment              Admission diagnosis:  Wound infection [T14.8XXA, L08.9] Cellulitis of left lower extremity [L03.116] Patient Active Problem List   Diagnosis Date Noted  . Left foot  infection 07/29/2019  . Cellulitis of left lower extremity   . Wound infection   . Peripheral neuropathy 05/17/2019   PCP:  Allayne Butcher, NP Pharmacy:   [id number] - [place], [place] [address] Phone: [phone] Fax: [phone]     Social Determinants of Health (SDOH) Interventions    Readmission Risk Interventions No flowsheet data found.

## 2019-07-30 NOTE — Progress Notes (Signed)
PROGRESS NOTE [facility]   Eleanor Reichenberger Goldie  [id number]  DOB: [birth year]  DOA: 07/28/2019 PCP: Allayne Butcher, NP   Brief Admission Hx: 58 y.o. 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  MDM/Assessment & Plan:   1. Osteomyelitis of left 1st toe - MRI confirms osteomyelitis.  Pt has discussed with surgery and opted for 6-8 weeks of IV antibiotics.  Cultures reportedly done at Dr. Serita Grit office but no results are available yet.  Ordered for PICC line placement and plan to send home on empiric antibiotic coverage.  2. Left leg cellulitis - mild - improving with IV antibiotics.  3. Essential hypertension -resuming home meds, blood pressures well controlled. 4. GERD - continue protonix daily for GI protection.  5. GAD - resume home meds   DVT prophylaxis: lovenox  Code Status: Full  Family Communication: patient updated at bedside Disposition Plan: home with IV antibiotics after PICC placed   Consultants:  Surgery Dr. Posey Pronto   Procedures:    Antimicrobials:  Vancomycin   zosyn   Subjective: Pt reports no changes in toe condition.  No other complaints.    Objective: Vitals:   07/29/19 2130 07/29/19 2259 07/30/19 0506 07/30/19 0856  BP: 127/78  124/73   Pulse: 75  68 70  Resp: 18  18 18   Temp: 98.2 F (36.8 C)  (!) 97.4 F (36.3 C)   TempSrc: Oral  Oral   SpO2: 97% 97% 97% 99%  Weight:      Height:        Intake/Output Summary (Last 24 hours) at 07/30/2019 1122 Last data filed at 07/30/2019 0500 Gross per 24 hour  Intake 780.3 ml  Output --  Net 780.3 ml   Filed Weights   07/28/19 1919  Weight: 108.9 kg   REVIEW OF SYSTEMS  As per history otherwise all reviewed and reported negative  Exam:  General  exam: awake, alert, NAD, cooperative.  Respiratory system: Clear. No increased work of breathing. Cardiovascular system: S1 & S2 heard. No JVD, murmurs, gallops, clicks or pedal edema. Gastrointestinal system: Abdomen is nondistended, soft and nontender. Normal bowel sounds heard. Central nervous system: Alert and oriented. No focal neurological deficits. Extremities: swollen red left 1st toe, no drainage seen.  Data Reviewed: Basic Metabolic Panel: Recent Labs  Lab 07/28/19 2053 07/29/19 0655 07/30/19 0846  NA 136 135 140  K 4.1 3.8 4.8  CL 100 105 105  CO2 24 21* 24  GLUCOSE 127* 181* 156*  BUN 25* 24* 20  CREATININE 1.24 1.32* 1.21  CALCIUM 9.3 8.3* 8.8*  MG  --   --  2.1   Liver Function Tests: Recent Labs  Lab 07/29/19 0655 07/30/19 0846  AST 16 21  ALT 28 30  ALKPHOS 78 71  BILITOT 0.9 0.9  PROT 7.1 7.4  ALBUMIN 3.5 3.7   No results for input(s): LIPASE, AMYLASE in the last 168 hours. No results for input(s): AMMONIA in the last 168 hours. CBC: Recent Labs  Lab 07/28/19 2053 07/29/19 0655 07/30/19 0846  WBC 11.6* 8.5 9.1  NEUTROABS 7.8*  --  5.6  HGB 17.2* 16.1 16.0  HCT 52.0 48.2 48.9  MCV 87.2 87.0 88.7  PLT 256 226 245   Cardiac Enzymes: No results for input(s): CKTOTAL, CKMB, CKMBINDEX,  TROPONINI in the last 168 hours. CBG (last 3)  No results for input(s): GLUCAP in the last 72 hours. Recent Results (from the past 240 hour(s))  SARS CORONAVIRUS 2 (TAT 6-24 HRS) Nasopharyngeal Nasopharyngeal Swab     Status: None   Collection Time: 07/28/19  8:37 PM   Specimen: Nasopharyngeal Swab  Result Value Ref Range Status   SARS Coronavirus 2 NEGATIVE NEGATIVE Final    Comment: (NOTE) SARS-CoV-2 target nucleic acids are NOT DETECTED. The SARS-CoV-2 RNA is generally detectable in upper and lower respiratory specimens during the acute phase of infection. Negative results do not preclude SARS-CoV-2 infection, do not rule out co-infections with other  pathogens, and should not be used as the sole basis for treatment or other patient management decisions. Negative results must be combined with clinical observations, patient history, and epidemiological information. The expected result is Negative. Fact Sheet for Patients: SugarRoll.be Fact Sheet for Healthcare Providers: https://www.woods-mathews.com/ This test is not yet approved or cleared by the [place] FDA and  has been authorized for detection and/or diagnosis of SARS-CoV-2 by FDA under an Emergency Use Authorization (EUA). This EUA will remain  in effect (meaning this test can be used) for the duration of the COVID-19 declaration under Section 56 4(b)(1) of the Act, 21 U.S.C. section 360bbb-3(b)(1), unless the authorization is terminated or revoked sooner. Performed at [facility], [place] [address] [place], [place] [address]   MRSA PCR Screening     Status: None   Collection Time: 07/30/19  8:26 AM   Specimen: Nasopharyngeal  Result Value Ref Range Status   MRSA by PCR NEGATIVE NEGATIVE Final    Comment:        The GeneXpert MRSA Assay (FDA approved for NASAL specimens only), is one component of a comprehensive MRSA colonization surveillance program. It is not intended to diagnose MRSA infection nor to guide or monitor treatment for MRSA infections. Performed at [facility], [address] [place], [place] [address]      Studies: Mr Foot Left Wo Contrast  Result Date: 07/29/2019 CLINICAL DATA:  Left great toe infection, swelling, diabetes EXAM: MRI OF THE LEFT FOOT WITHOUT CONTRAST TECHNIQUE: Multiplanar, multisequence MR imaging of the left forefoot was performed. No intravenous contrast was administered. COMPARISON:  X-ray 07/28/2019, 06/15/2018 FINDINGS: Bones/Joint/Cartilage Cortical erosion of the distal tuft of the great toe with bone marrow edema throughout the great toe distal phalanx. There is confluent  low T1 signal changes throughout the distal tuft and along the lateral margin of the distal phalanx closely approximating the IP joint (series 6, image 18). There is mild edema like marrow signal within the great toe proximal phalanx adjacent to the IP joint without T1 signal changes to suggest osteomyelitis. Patient is status post second digit amputation at the level of the neck of the second digit proximal phalanx. Subtle T2 hyperintense signal at the resection margin (series 7, image 10) without associated T1 signal abnormality. Marrow edema within the proximal metaphyses of the second, third, and fourth metatarsals without definite linear low signal intensity fracture line (series 5, images 7-9). There are small marginal erosions centered at the TMT joints (for example series 6, images 8-10). Patchy marrow edema throughout the bones of the midfoot including the navicular and cuneiforms. Dorsal proliferative changes throughout the midfoot. Ligaments Intrinsic foot ligaments including the Lisfranc ligament are intact. Muscles and Tendons Diffuse muscle edema and fatty infiltration of the intrinsic foot musculature. Flexor and extensor tendons are grossly intact. No  tenosynovial fluid collection. Soft tissues There is soft tissue swelling with skin thickening and subcutaneous edema at the level of the great toe distal phalanx. Mild subcutaneous edema over the dorsum of the foot. No well-defined or drainable fluid collections. IMPRESSION: 1. Cortical erosion and marrow signal changes within the great toe distal phalanx compatible with acute osteomyelitis. 2. Mild marrow edema within the distal aspect of the great toe proximal phalanx without associated T1 signal change. Findings favored to represent reactive osteitis without evidence of osteomyelitis. No joint effusion at the great toe IP joint to suggest septic arthritis. 3. Prominent marrow edema within the proximal metaphyses of the second, third, and fourth  metatarsals without discrete fracture line. Findings are favored to represent stress related changes. 4. Patchy milder marrow edema throughout the bones of the midfoot with scattered small marginal erosions within the midfoot and at the second-fourth metatarsal bases. Findings are nonspecific and may represent developing Charcot arthropathy versus an inflammatory arthropathy. Infection in this area is not excluded although felt to be less likely given the lack of changes within the adjacent soft tissues. 5. Diffuse muscle edema and fatty infiltration of the intrinsic foot musculature most consistent with diabetic neuropathy. Electronically Signed   By: Davina Poke M.D.   On: 07/29/2019 09:44   Dg Foot Complete Left  Result Date: 07/28/2019 CLINICAL DATA:  Left great toe infection for a month. EXAM: LEFT FOOT - COMPLETE 3+ VIEW COMPARISON:  None. FINDINGS: Great toe is swollen. Air is seen in the distal soft tissues. Possible erosion of the distal tuft. The second toes been amputated. The remainder of the toes are normal. Contour abnormality at the base of the third metatarsal. Metatarsals are otherwise normal. Degenerative changes in the midfoot. IMPRESSION: 1. Swollen soft tissues in the great toe. Air in the soft tissues consistent with history of infection. The soft tissue air could be from ulceration or a gas forming organism in the soft tissues. 2. Possible mild erosion in the lateral aspect of the distal first tuft suspicious for subtle osteomyelitis. This is not seen on other views. An MRI could better assess to confirm osteomyelitis. 3. Contour abnormality at the base of the third metatarsal. This region was not as well seen previously. This is not in close proximity to the known infection and the finding could be artifactual. Recommend attention to this region on the MRI if performed. Otherwise, recommend clinical correlation. Electronically Signed   By: Dorise Bullion III M.D   On: 07/28/2019  21:13   [place] Ekg Site Rite  Result Date: 07/30/2019 If Site Rite image not attached, placement could not be confirmed due to current cardiac rhythm.  Scheduled Meds:  buPROPion  150 mg Oral BID   carvedilol  25 mg Oral BID WC   enoxaparin (LOVENOX) injection  55 mg Subcutaneous Q24H   irbesartan  300 mg Oral Daily   pantoprazole  40 mg Oral Daily   spironolactone  25 mg Oral Daily   Continuous Infusions:  sodium chloride 50 mL/hr at 07/30/19 0830   piperacillin-tazobactam (ZOSYN)  IV 3.375 g (07/30/19 0659)    Active Problems:   Left foot infection   Cellulitis of left lower extremity   Wound infection  Time spent:   Irwin Brakeman, MD [facility] 07/30/2019, 11:22 AM    LOS: 1 day  How to contact the [facility] Attending or Consulting provider [place] or covering provider during after hours [place], for this patient?  1. Check the  care team in [facility] and look for a) attending/consulting [place] provider listed and b) the [facility] team listed 2. Log into [email or web address] and use [place]'s universal password to access. If you do not have the password, please contact the hospital operator. 3. Locate the [facility] provider you are looking for under [facility] and page to a number that you can be directly reached. 4. If you still have difficulty reaching the provider, please page the [facility] (Director on Call) for the Hospitalists listed on amion for assistance.

## 2019-07-30 NOTE — Progress Notes (Signed)
Spoke with Dianna RN re PICC order.  Notified to call [facility] if pt to be d/c home this weekend.  If to be d/c home Monday or Tuesday, VAS Team can place.

## 2019-07-30 NOTE — Progress Notes (Signed)
PHARMACY CONSULT NOTE FOR:  OUTPATIENT  PARENTERAL ANTIBIOTIC THERAPY (OPAT)  Indication: Osteomyelitis Regimen: Cefepime 2gm IV q8h, Vancomycin 1750mg  IV q24h for AUC 514 End date: 09/23/2019  IV antibiotic discharge orders are pended. To discharging provider:  please sign these orders via discharge navigator,  Select New Orders & click on the button choice - Manage This Unsigned Work.     Thank you for allowing pharmacy to be a part of this patient's care.  Isac Sarna, BS [place], BCPS Clinical Pharmacist Pager [phone] 07/30/2019, 1:29 PM

## 2019-07-30 NOTE — Progress Notes (Signed)
S: Patient was seen at bedside today. Resting comfortably. No acute events over night. He states the left leg is feeling better. Its not as sensitive anymore.   O: Vascular: DP palpable. PT faintly palpable. Edema noted of the left lower leg. Capilllary refill time <3 seconds. Hair growth noted to lesser digits. Foot is warm to touch. No calf pain or chest pain.  Dermatology: There is full thickness ulcer noted at the plantar aspect of the IPJ of the left hallux. The ulcer is measured to be 1.3x0.3x0.4. Ulcer does probe to bone. There is serous drainage noted. There is increase in warmth and redness to the level of the MPJ. The left hallux nail is dystrophic. The skin is discolored, and macerated with hemorrhage appearance at the distal tip of the left hallux. Upon removal of the skin at the most distal tip there is two more new wounds which one of the lateral wound probes to bone.  Musculoskeletol: Partial 2nd toe amputation noted on the left. The left hallux is contracted at the IPJ.  Neurology: Protective sensation is absent. Patient has no feeling due to neuropathy.   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  Peripheral neuropathy.   Plan: Patient examined and evaluated at bedside.  I cleansed the wound and applied collagen dressing on it.  I explained him all the risk and benefits of the bone infection and offered different treatment option.  I discussed partial hallux amputation as outpatient if he decides to choose it.  I recommend 6-8 weeks if IV antibiotics per ID recommendation. I did wound cultures at the office and the results are pending. We can adjust the antibiotics as outpatient if need to. I also discussed hyperbaric treatment and wound care in [place]. I will call the wound care center on Monday 08/01/2019 to see if he qualifies for the hyperbaric treatment.  As of now patient wants to try to save the toe but he is not against the amputation either. If  he does not qualify for the hyperbaric, we can do the amputation as outpatient.  Patient can be discharged with PICC line. Patient can be partial weightbearing with surgical shoe. Patient already has surgical shoe. I explained him not to get the foot wet. He already has dressing supply at home and know how to do dressing changes.  Patient will follow up with me in the office 08/02/2019 at [facility] office.

## 2019-07-30 NOTE — Discharge Instructions (Signed)
Osteomyelitis, Adult  Bone infections (osteomyelitis) occur when bacteria or other germs get inside a bone. This can happen if you have an infection in another part of your body that spreads through your blood. Germs from your skin or from outside of your body can also cause this type of infection if you have a wound or a broken bone (fracture) that breaks the skin. Bone infections need to be treated quickly to prevent bone damage and to prevent the infection from spreading to other areas of your body. What are the causes? Most bone infections are caused by bacteria. They can also be caused by other germs, such as viruses and funguses. What increases the risk? You are more likely to develop this condition if you:  Recently had surgery, especially bone or joint surgery.  Have a long-term (chronic) disease, such as: ? Diabetes. ? HIV (human immunodeficiency virus). ? Rheumatoid arthritis. ? Sickle cell anemia. ? Kidney disease that requires dialysis.  Are aged 56 years or older.  Have a condition or take medicines that block or weaken your body's defense system (immune system).  Have a condition that reduces your blood flow.  Have an artificial joint.  Have had a joint or bone repaired with plates or screws (surgical hardware).  Use IV drugs.  Have a central line for IV access.  Have had trauma, such as stepping on a nail or a broken bone that came through the skin. What are the signs or symptoms? Symptoms vary depending on the type and location of your infection. Common symptoms of bone infections include:  Fever and chills.  Skin redness and warmth.  Swelling.  Pain and stiffness.  Drainage of fluid or pus near the infection. How is this diagnosed? This condition may be diagnosed based on:  Your symptoms and medical history.  A physical exam.  Tests, such as: ? A sample of tissue, fluid, or blood taken to be examined under a microscope. ? Pus or discharge swabbed  from a wound for testing to identify germs and to determine what type of medicine will kill them (culture and sensitivity). ? Blood tests.  Imaging studies. These may include: ? X-rays. ? MRI. ? CT scan. ? Bone scan. ? Ultrasound. How is this treated? Treatment for this condition depends on the cause and type of infection. Antibiotic medicines are usually the first treatment for a bone infection. This may be done in a hospital at first. You may have to continue IV antibiotics at home or take antibiotics by mouth for several weeks after that. Other treatments may include surgery to remove:  Dead or dying tissue from a bone.  An infected artificial joint.  Infected plates or screws that were used to repair a broken bone. Follow these instructions at home: Medicines   Take over-the-counter and prescription medicines only as told by your health care provider.  Take your antibiotic medicine as told by your health care provider. Do not stop taking the antibiotic even if you start to feel better.  Follow instructions from your health care provider about how to take IV antibiotics at home. You may need to have a nurse come to your home to give you the IV antibiotics. General instructions   Ask your health care provider if you have any restrictions on your activities.  If directed, put ice on the affected area: ? Put ice in a plastic bag. ? Place a towel between your skin and the bag. ? Leave the ice on for 20  minutes, 2-3 times a day.  Wash your hands often with soap and water. If soap and water are not available, use hand sanitizer.  Do not use any products that contain nicotine or tobacco, such as cigarettes and e-cigarettes. These can delay bone healing. If you need help quitting, ask your health care provider.  Keep all follow-up visits as told by your health care provider. This is important. Contact a health care provider if:  You develop a fever or chills.  You have  redness, warmth, pain, or swelling that returns after treatment. Get help right away if:  You have rapid breathing or you have trouble breathing.  You have chest pain.  You cannot drink fluids or make urine.  The affected area swells, changes color, or turns blue.  You have numbness or severe pain in the affected area. Summary  Bone infections (osteomyelitis) occur when bacteria or other germs get inside a bone.  You may be more likely to get this type of infection if you have a condition, such as diabetes, that lowers your ability to fight infection or increases your chances of getting an infection.  Most bone infections are caused by bacteria. They can also be caused by other germs, such as viruses and funguses.  Treatment for this condition usually starts with taking antibiotics. Further treatment depends on the cause and type of infection. This information is not intended to replace advice given to you by your health care provider. Make sure you discuss any questions you have with your health care provider. Document Released: 09/01/2005 Document Revised: 09/17/2017 Document Reviewed: 09/10/2017 Elsevier Patient Education  2020 [place] Guide  A peripherally inserted central catheter (PICC) is a form of IV access that allows medicines and IV fluids to be quickly distributed throughout the body. The PICC is a long, thin, flexible tube (catheter) that is inserted into a vein in the upper arm. The catheter ends in a large vein in the chest (superior vena cava, or SVC). After the PICC is inserted, a chest X-ray may be done to make sure that it is in the correct place. A PICC may be placed for different reasons, such as:  To give medicines and liquid nutrition.  To give IV fluids and blood products.  If there is trouble placing a peripheral intravenous (PIV) catheter. If taken care of properly, a PICC can remain in place for several months. Having a PICC can  also allow a person to go home from the hospital sooner. Medicine and PICC care can be managed at home by a family member, caregiver, or home health care team. What are the risks? Generally, having a PICC is safe. However, problems may occur, including:  A blood clot (thrombus) forming in or at the tip of the PICC.  A blood clot forming in a vein (deep vein thrombosis) or traveling to the lung (pulmonary embolism).  Inflammation of the vein (phlebitis) in which the PICC is placed.  Infection. Central line associated blood stream infection (CLABSI) is a serious infection that often requires hospitalization.  PICC movement (malposition). The PICC tip may move from its original position due to excessive physical activity, forceful coughing, sneezing, or vomiting.  A break or cut in the PICC. It is important not to use scissors near the PICC.  Nerve or tendon irritation or injury during PICC insertion. How to take care of your PICC Preventing problems  You and any caregivers should wash your hands often  with soap. Wash hands: ? Before touching the PICC line or the infusion device. ? Before changing a bandage (dressing).  Flush the PICC as told by your health care provider. Let your health care provider know right away if the PICC is hard to flush or does not flush. Do not use force to flush the PICC.  Do not use a syringe that is less than 10 mL to flush the PICC.  Avoid blood pressure checks on the arm in which the PICC is placed.  Never pull or tug on the PICC.  Do not take the PICC out yourself. Only a trained clinical professional should remove the PICC.  Use clean and sterile supplies only. Keep the supplies in a dry place. Do not reuse needles, syringes, or any other supplies. Doing that can lead to infection.  Keep pets and children away from your PICC line.  Check the PICC insertion site every day for signs of infection. Check for: ? Leakage. ? Redness, swelling, or  pain. ? Fluid or blood. ? Warmth. ? Pus or a bad smell. PICC dressing care  Keep your PICC bandage (dressing) clean and dry to prevent infection.  Do not take baths, swim, or use a hot tub until your health care provider approves. Ask your health care provider if you can take showers. You may only be allowed to take sponge baths for bathing. When you are allowed to shower: ? Ask your health care provider to teach you how to wrap the PICC line. ? Cover the PICC line with clear plastic wrap and tape to keep it dry while showering.  Follow instructions from your health care provider about how to take care of your insertion site and dressing. Make sure you: ? Wash your hands with soap and water before you change your bandage (dressing). If soap and water are not available, use hand sanitizer. ? Change your dressing as told by your health care provider. ? Leave stitches (sutures), skin glue, or adhesive strips in place. These skin closures may need to stay in place for 2 weeks or longer. If adhesive strip edges start to loosen and curl up, you may trim the loose edges. Do not remove adhesive strips completely unless your health care provider tells you to do that.  Change your PICC dressing if it becomes loose or wet. General instructions   Carry your PICC identification card or wear a medical alert bracelet at all times.  Keep the tube clamped at all times, unless it is being used.  Carry a smooth-edge clamp with you at all times to place on the tube if it breaks.  Do not use scissors or sharp objects near the tube.  You may bend your arm and move it freely. If your PICC is near or at the bend of your elbow, avoid activity with repeated motion at the elbow.  Avoid lifting heavy objects as told by your health care provider.  Keep all follow-up visits as told by your health care provider. This is important. Disposal of supplies  Throw away any syringes in a disposal container that is  meant for sharp items (sharps container). You can buy a sharps container from a pharmacy, or you can make one by using an empty hard plastic bottle with a cover.  Place any used dressings or infusion bags into a plastic bag. Throw that bag in the trash. Contact a health care provider if:  You have pain in your arm, ear, face, or teeth.  You have a fever or chills.  You have redness, swelling, or pain around the insertion site.  You have fluid or blood coming from the insertion site.  Your insertion site feels warm to the touch.  You have pus or a bad smell coming from the insertion site.  Your skin feels hard and raised around the insertion site. Get help right away if:  Your PICC is accidentally pulled all the way out. If this happens, cover the insertion site with a bandage or gauze dressing. Do not throw the PICC away. Your health care provider will need to check it.  Your PICC was tugged or pulled and has partially come out. Do not  push the PICC back in.  You cannot flush the PICC, it is hard to flush, or the PICC leaks around the insertion site when it is flushed.  You hear a "flushing" sound when the PICC is flushed.  You feel your heart racing or skipping beats.  There is a hole or tear in the PICC.  You have swelling in the arm in which the PICC was inserted.  You have a red streak going up your arm from where the PICC was inserted. Summary  A peripherally inserted central catheter (PICC) is a long, thin, flexible tube (catheter) that is inserted into a vein in the upper arm.  The PICC is inserted using a sterile technique by a specially trained nurse or physician. Only a trained clinical professional should remove it.  Keep your PICC identification card with you at all times.  Avoid blood pressure checks on the arm in which the PICC is placed.  If cared for properly, a PICC can remain in place for several months. Having a PICC can also allow a person to go home  from the hospital sooner. This information is not intended to replace advice given to you by your health care provider. Make sure you discuss any questions you have with your health care provider. Document Released: 03/08/2003 Document Revised: 08/14/2017 Document Reviewed: 10/04/2016 Elsevier Patient Education  2020 [employer].

## 2019-07-30 NOTE — Progress Notes (Signed)
The patient has received medical discharge form Dr. Wynetta Emery and case management was notified. Due to lack of continuity of care over weekend for Jacob Munoz care and insurance requirements he will be staying overnight. MD and patient notified. Elodia Florence RN

## 2019-07-31 LAB — COMPREHENSIVE METABOLIC PANEL
ALT: 31 U/L (ref 0–44)
AST: 17 U/L (ref 15–41)
Albumin: 3.5 g/dL (ref 3.5–5.0)
Alkaline Phosphatase: 66 U/L (ref 38–126)
Anion gap: 8 (ref 5–15)
BUN: 18 mg/dL (ref 6–20)
CO2: 22 mmol/L (ref 22–32)
Calcium: 8.6 mg/dL — ABNORMAL LOW (ref 8.9–10.3)
Chloride: 107 mmol/L (ref 98–111)
Creatinine, Ser: 1.08 mg/dL (ref 0.61–1.24)
GFR calc Af Amer: >60 mL/min (ref >60)
GFR calc non Af Amer: >60 mL/min (ref >60)
Glucose, Bld: 174 mg/dL — ABNORMAL HIGH (ref 70–99)
Potassium: 4.2 mmol/L (ref 3.5–5.1)
Sodium: 137 mmol/L (ref 135–145)
Total Bilirubin: 0.9 mg/dL (ref 0.3–1.2)
Total Protein: 7.1 g/dL (ref 6.5–8.1)

## 2019-07-31 MED ORDER — SODIUM CHLORIDE 0.9 % IV SOLN
2.0000 g | Freq: Three times a day (TID) | INTRAVENOUS | Status: DC
  Administered 2019-07-31: 12:00:00 2 g via INTRAVENOUS
  Filled 2019-07-31: qty 2

## 2019-07-31 MED ORDER — HEPARIN SOD (PORK) LOCK FLUSH 100 UNIT/ML IV SOLN
250.0000 [IU] | INTRAVENOUS | Status: AC | PRN
  Administered 2019-07-31: 13:00:00 250 [IU]
  Filled 2019-07-31: qty 5

## 2019-07-31 NOTE — Progress Notes (Signed)
Discharge instructions given.. Reviewed perscriptions for maxipine and vancomycin and florastor.  Pt and wife verbalized understanding of  meds and of f/u appts.

## 2019-07-31 NOTE — TOC Transition Note (Signed)
Transition of Care [facility]) - CM/SW Discharge Note   Patient Details  Name: MCCOY PITZ MRN: [id number] Date of Birth: 08-Feb-1961  Transition of Care [facility]) CM/SW Contact:  Latanya Maudlin, RN Phone Number: 07/31/2019, 10:20 AM   Clinical Narrative:  Patient to be discharged per MD order. Orders in place for home health services. Patient in need for home infusion. [facility] is assisting with coordination. [facility] will be used for nursing care. Patient may also have to drive himself to outpatient infusion due to medication frequency and need. Patient still drives. Pam from infusion will be able to go to [facility] today to begin infusion education. Since patient [place] state [place] plans are to deliver medication to bedside.     Final next level of care: [place] Barriers to Discharge: Continued Medical Work up   Patient Goals and CMS Choice        Discharge Placement                       Discharge Plan and Services   Discharge Planning Services: CM Consult Post Acute Care Choice: Home Health          DME Arranged: IV pump/equipment                    Social Determinants of Health (SDOH) Interventions     Readmission Risk Interventions No flowsheet data found.

## 2019-08-08 ENCOUNTER — Encounter (HOSPITAL_COMMUNITY)
Admission: RE | Admit: 2019-08-08 | Discharge: 2019-08-08 | Disposition: A | Discharge status: Home / Self Care | Payer: BC Managed Care – PPO | Source: Skilled Nursing Facility | Attending: Family Medicine | Admitting: Family Medicine

## 2019-08-08 DIAGNOSIS — T148XXA Other injury of unspecified body region, initial encounter: Secondary | ICD-10-CM | POA: Insufficient documentation

## 2019-08-08 LAB — BASIC METABOLIC PANEL
Anion gap: 12 (ref 5–15)
BUN: 26 mg/dL — ABNORMAL HIGH (ref 6–20)
CO2: 22 mmol/L (ref 22–32)
Calcium: 9.1 mg/dL (ref 8.9–10.3)
Chloride: 103 mmol/L (ref 98–111)
Creatinine, Ser: 1.27 mg/dL — ABNORMAL HIGH (ref 0.61–1.24)
GFR calc Af Amer: >60 mL/min (ref >60)
GFR calc non Af Amer: >60 mL/min (ref >60)
Glucose, Bld: 134 mg/dL — ABNORMAL HIGH (ref 70–99)
Potassium: 4.8 mmol/L (ref 3.5–5.1)
Sodium: 137 mmol/L (ref 135–145)

## 2019-08-08 LAB — VANCOMYCIN, TROUGH: Vancomycin Tr: 7 ug/mL — ABNORMAL LOW (ref 15–20)

## 2019-08-21 ENCOUNTER — Encounter (HOSPITAL_COMMUNITY)
Admission: RE | Admit: 2019-08-21 | Discharge: 2019-08-21 | Disposition: A | Discharge status: Home / Self Care | Payer: BC Managed Care – PPO | Source: Skilled Nursing Facility | Attending: Family Medicine | Admitting: Family Medicine

## 2019-08-21 DIAGNOSIS — T148XXA Other injury of unspecified body region, initial encounter: Secondary | ICD-10-CM | POA: Insufficient documentation

## 2019-08-21 DIAGNOSIS — L03116 Cellulitis of left lower limb: Secondary | ICD-10-CM | POA: Insufficient documentation

## 2019-08-21 DIAGNOSIS — L089 Local infection of the skin and subcutaneous tissue, unspecified: Secondary | ICD-10-CM | POA: Diagnosis present

## 2019-08-21 LAB — VANCOMYCIN, TROUGH: Vancomycin Tr: <4 ug/mL — ABNORMAL LOW (ref 15–20)

## 2019-08-21 LAB — CBC WITH DIFFERENTIAL/PLATELET
Abs Immature Granulocytes: 0.06 10*3/uL (ref 0.00–0.07)
Basophils Absolute: 0.1 10*3/uL (ref 0.0–0.1)
Basophils Relative: 1 %
Eosinophils Absolute: 0.9 10*3/uL — ABNORMAL HIGH (ref 0.0–0.5)
Eosinophils Relative: 9 %
HCT: 50.4 % (ref 39.0–52.0)
Hemoglobin: 17 g/dL (ref 13.0–17.0)
Immature Granulocytes: 1 %
Lymphocytes Relative: 23 %
Lymphs Abs: 2.2 10*3/uL (ref 0.7–4.0)
MCH: 29.5 pg (ref 26.0–34.0)
MCHC: 33.7 g/dL (ref 30.0–36.0)
MCV: 87.3 fL (ref 80.0–100.0)
Monocytes Absolute: 1.3 10*3/uL — ABNORMAL HIGH (ref 0.1–1.0)
Monocytes Relative: 13 %
Neutro Abs: 5.4 10*3/uL (ref 1.7–7.7)
Neutrophils Relative %: 53 %
Platelets: 188 10*3/uL (ref 150–400)
RBC: 5.77 MIL/uL (ref 4.22–5.81)
RDW: 14.7 % (ref 11.5–15.5)
WBC: 10 10*3/uL (ref 4.0–10.5)
nRBC: 0 % (ref 0.0–0.2)

## 2019-08-21 LAB — COMPREHENSIVE METABOLIC PANEL
ALT: 42 U/L (ref 0–44)
AST: 24 U/L (ref 15–41)
Albumin: 3.8 g/dL (ref 3.5–5.0)
Alkaline Phosphatase: 86 U/L (ref 38–126)
Anion gap: 11 (ref 5–15)
BUN: 20 mg/dL (ref 6–20)
CO2: 23 mmol/L (ref 22–32)
Calcium: 8.6 mg/dL — ABNORMAL LOW (ref 8.9–10.3)
Chloride: 103 mmol/L (ref 98–111)
Creatinine, Ser: 1.14 mg/dL (ref 0.61–1.24)
GFR calc Af Amer: >60 mL/min (ref >60)
GFR calc non Af Amer: >60 mL/min (ref >60)
Glucose, Bld: 141 mg/dL — ABNORMAL HIGH (ref 70–99)
Potassium: 4.4 mmol/L (ref 3.5–5.1)
Sodium: 137 mmol/L (ref 135–145)
Total Bilirubin: 0.7 mg/dL (ref 0.3–1.2)
Total Protein: 7 g/dL (ref 6.5–8.1)

## 2019-08-26 ENCOUNTER — Other Ambulatory Visit: Payer: Self-pay

## 2020-02-27 IMAGING — MR MR FOOT*L* W/O CM
4 of 5 series · 18 of 40 positions shown · non-contrast
Comparison: X-ray 07/28/2019, 06/15/2018

CLINICAL DATA: Left great toe infection, swelling, diabetes

EXAM:
MRI OF THE LEFT FOOT WITHOUT CONTRAST
TECHNIQUE: Multiplanar, multisequence MR imaging of the left forefoot was
performed. No intravenous contrast was administered.

[Series 3: T1 · coronal · 4.0mm · 0.23mm/px · 3 of 38 slices shown (1 of 2)]
[im 4/38]
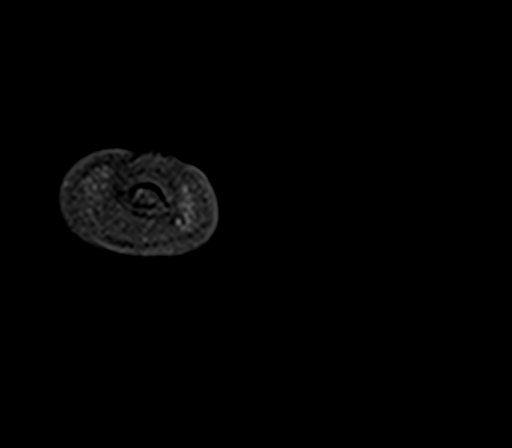
[im 19/38]
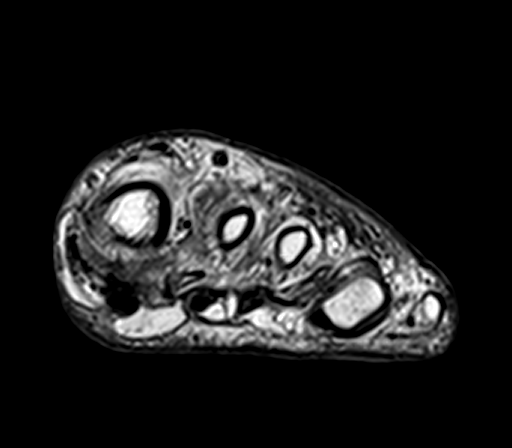
[im 34/38]
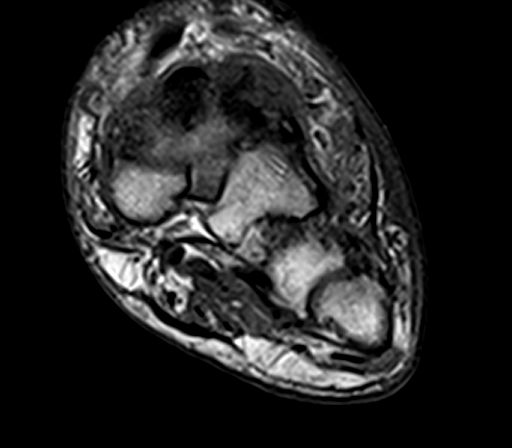

[Series 4: T2 fat-sat · coronal · 4.0mm · 0.23mm/px · 9 of 38 slices shown (1 of 2)]
[im 1/38]
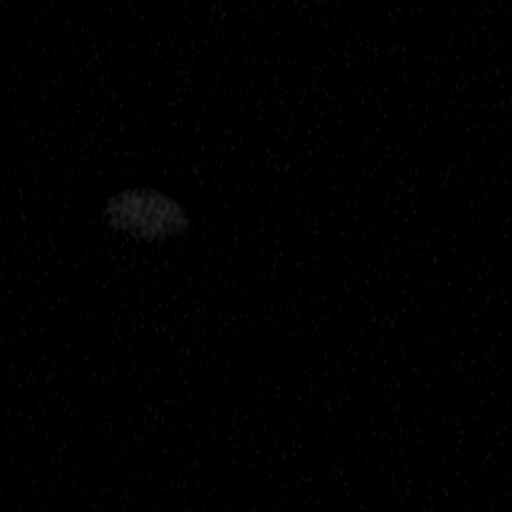
[im 7/38]
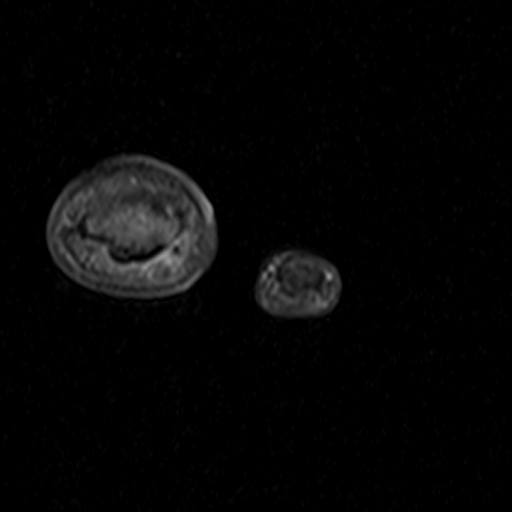
[im 11/38]
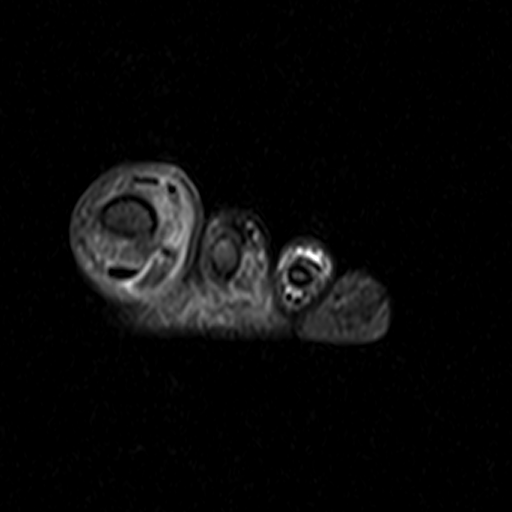
[im 17/38]
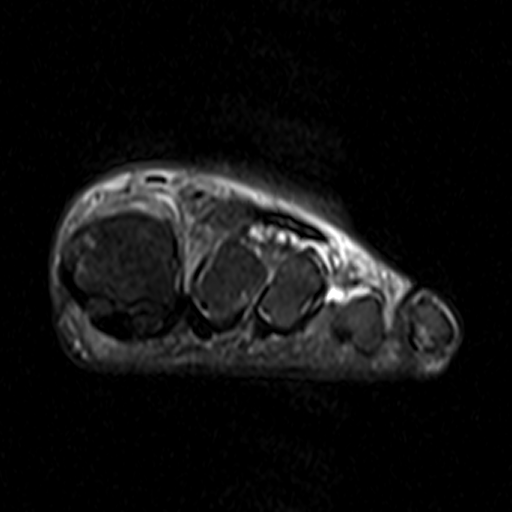
[im 21/38]
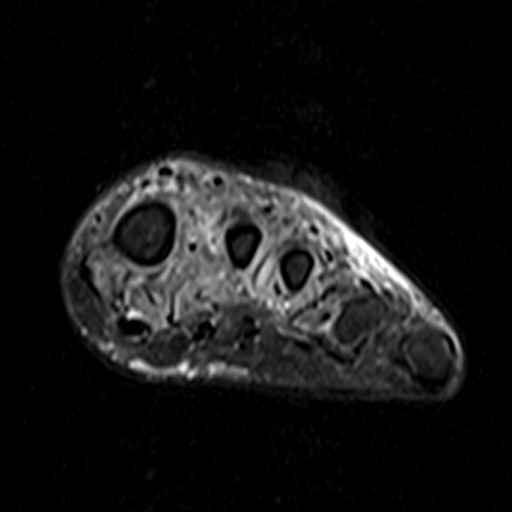
[im 27/38]
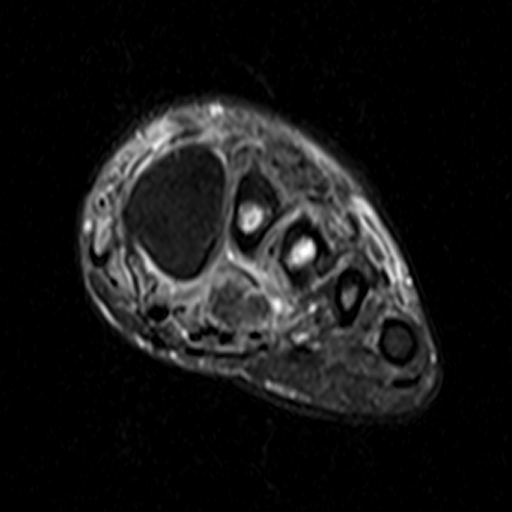
[im 31/38]
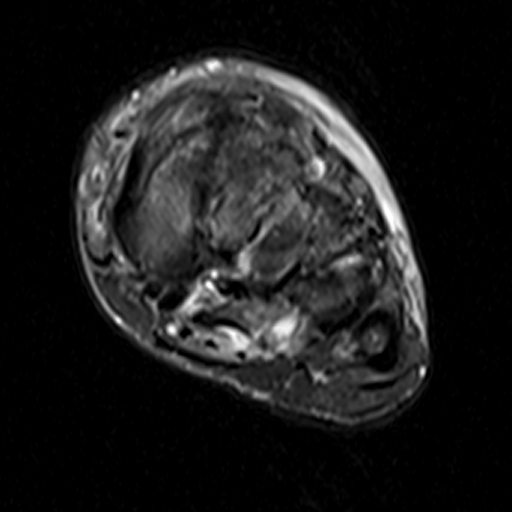
[im 34/38]
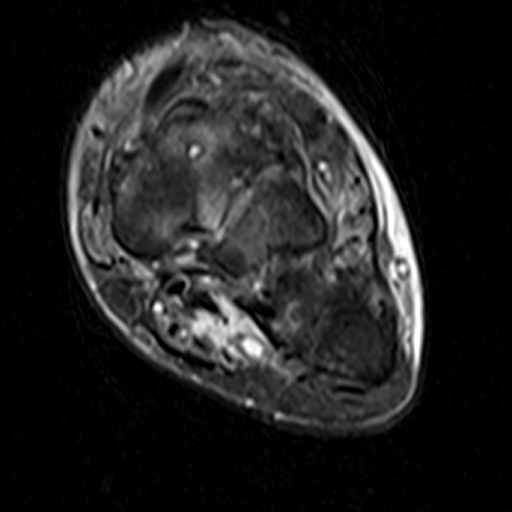
[im 38/38]
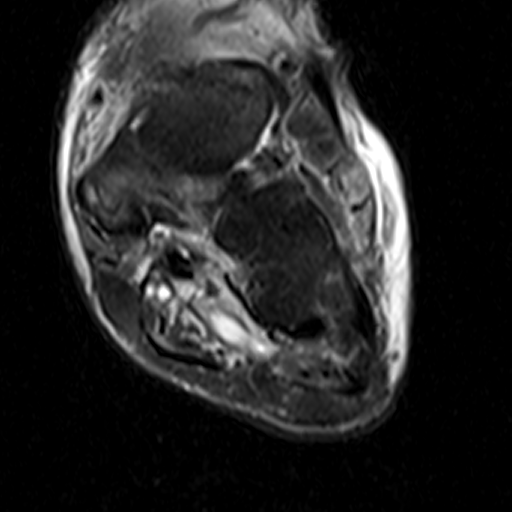

[Series 5: T2 fat-sat · axial · 4.0mm · 0.40mm/px · z∈[-30,+40]mm · 3 of 16 slices shown (2 of 2)]
[im 1/16]
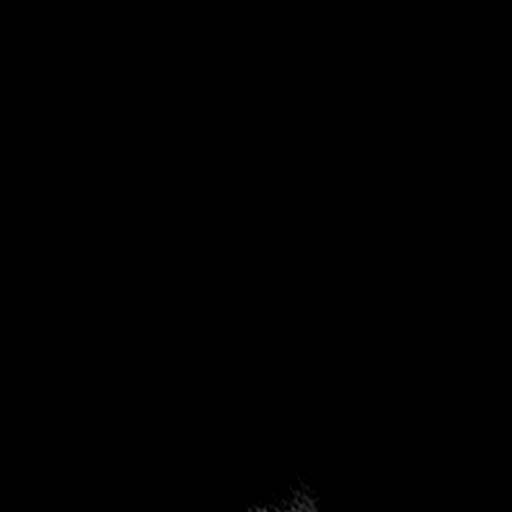
[im 8/16]
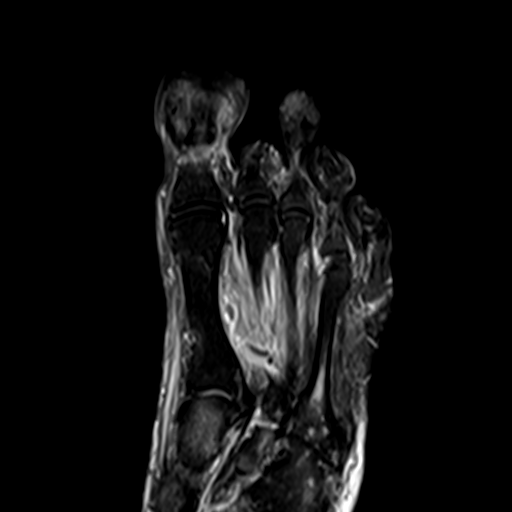
[im 16/16]
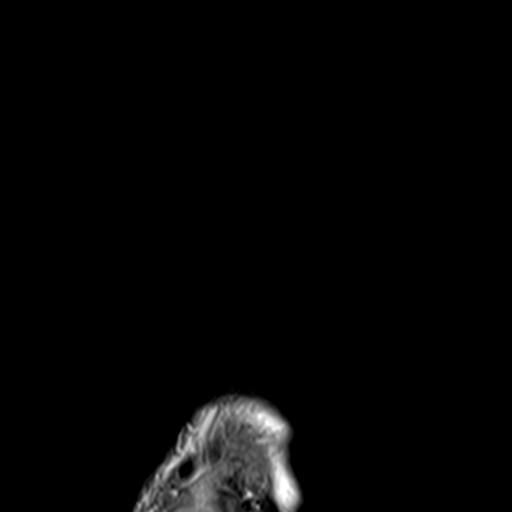

[Series 6: T1 · axial · 4.0mm · 0.40mm/px · z∈[-30,+40]mm · 3 of 16 slices shown (2 of 2)]
[im 1/16]
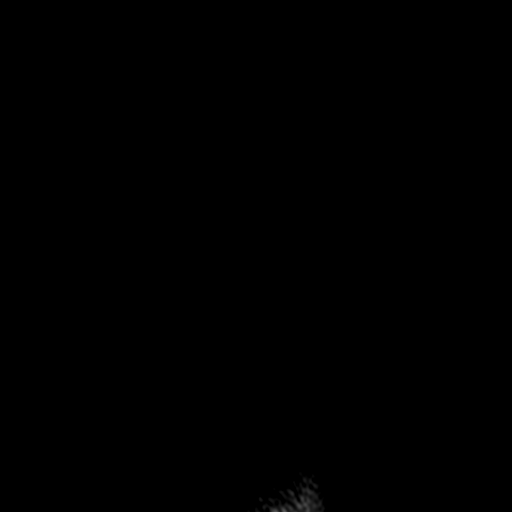
[im 8/16]
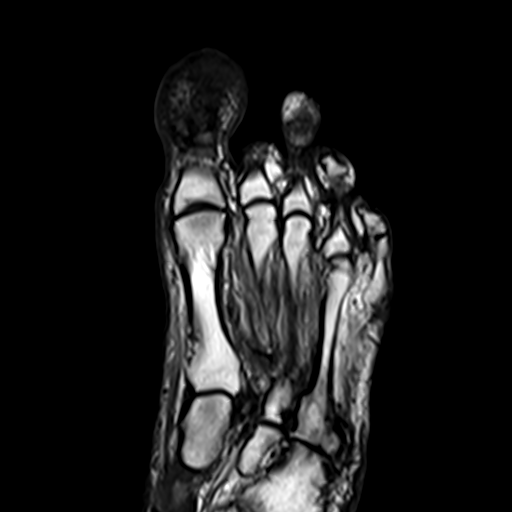
[im 16/16]
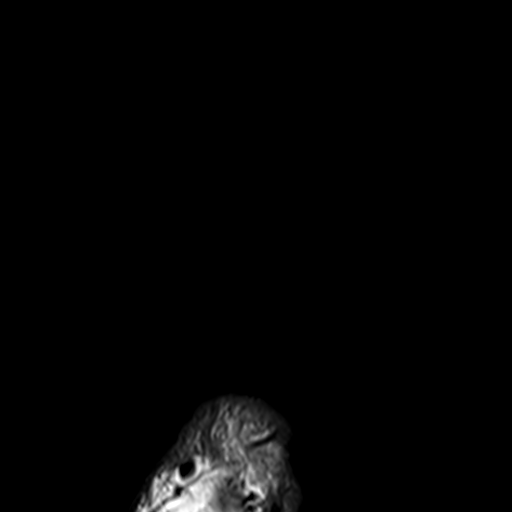

[18 of 40 positions shown; findings below may reference images not displayed]

FINDINGS: Bones/Joint/Cartilage

Cortical erosion of the distal tuft of the great toe with bone
marrow edema throughout the great toe distal phalanx. There is
confluent low T1 signal changes throughout the distal tuft and along
the lateral margin of the distal phalanx closely approximating the
IP joint (series 6, image 18). There is mild edema like marrow
signal within the great toe proximal phalanx adjacent to the IP
joint without T1 signal changes to suggest osteomyelitis.

Patient is status post second digit amputation at the level of the
neck of the second digit proximal phalanx. Subtle T2 hyperintense
signal at the resection margin (series 7, image 10) without
associated T1 signal abnormality.

Marrow edema within the proximal metaphyses of the second, third,
and fourth metatarsals without definite linear low signal intensity
fracture line (series 5, images 7-9). There are small marginal
erosions centered at the TMT joints (for example series 6, images
8-10).

Patchy marrow edema throughout the bones of the midfoot including
the navicular and cuneiforms. Dorsal proliferative changes
throughout the midfoot.

Ligaments

Intrinsic foot ligaments including the Lisfranc ligament are intact.

Muscles and Tendons

Diffuse muscle edema and fatty infiltration of the intrinsic foot
musculature. Flexor and extensor tendons are grossly intact. No
tenosynovial fluid collection.

Soft tissues

There is soft tissue swelling with skin thickening and subcutaneous
edema at the level of the great toe distal phalanx. Mild
subcutaneous edema over the dorsum of the foot. No well-defined or
drainable fluid collections.
IMPRESSION: 1. Cortical erosion and marrow signal changes within the great toe
distal phalanx compatible with acute osteomyelitis.
2. Mild marrow edema within the distal aspect of the great toe
proximal phalanx without associated T1 signal change. Findings
favored to represent reactive osteitis without evidence of
osteomyelitis. No joint effusion at the great toe IP joint to
suggest septic arthritis.
3. Prominent marrow edema within the proximal metaphyses of the
second, third, and fourth metatarsals without discrete fracture
line. Findings are favored to represent stress related changes.
4. Patchy milder marrow edema throughout the bones of the midfoot
with scattered small marginal erosions within the midfoot and at the
second-fourth metatarsal bases. Findings are nonspecific and may
represent developing Charcot arthropathy versus an inflammatory
arthropathy. Infection in this area is not excluded although felt to
be less likely given the lack of changes within the adjacent soft
tissues.
5. Diffuse muscle edema and fatty infiltration of the intrinsic foot
musculature most consistent with diabetic neuropathy.
